# Patient Record
Sex: Male | Born: 1995
Health system: Southern US, Community
[De-identification: ages and names within clinical notes are randomized; demographics above are authoritative.]

## PROBLEM LIST (undated history)

## (undated) DIAGNOSIS — F19959 Other psychoactive substance use, unspecified with psychoactive substance-induced psychotic disorder, unspecified: Secondary | ICD-10-CM

## (undated) DIAGNOSIS — F1910 Other psychoactive substance abuse, uncomplicated: Secondary | ICD-10-CM

## (undated) DIAGNOSIS — F1994 Other psychoactive substance use, unspecified with psychoactive substance-induced mood disorder: Principal | ICD-10-CM

---
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
## 2015-07-01 ENCOUNTER — Encounter: Payer: Self-pay | Admitting: Emergency Medicine

## 2015-07-01 ENCOUNTER — Emergency Department
Admission: EM | Admit: 2015-07-01 | Discharge: 2015-07-01 | Disposition: A | Discharge status: Home / Self Care | Payer: BLUE CROSS/BLUE SHIELD | Attending: Emergency Medicine | Admitting: Emergency Medicine

## 2015-07-01 ENCOUNTER — Emergency Department: Payer: BLUE CROSS/BLUE SHIELD

## 2015-07-01 DIAGNOSIS — R1110 Vomiting, unspecified: Secondary | ICD-10-CM | POA: Diagnosis not present

## 2015-07-01 DIAGNOSIS — R569 Unspecified convulsions: Secondary | ICD-10-CM | POA: Diagnosis not present

## 2015-07-01 DIAGNOSIS — F1210 Cannabis abuse, uncomplicated: Secondary | ICD-10-CM | POA: Diagnosis not present

## 2015-07-01 DIAGNOSIS — R519 Headache, unspecified: Secondary | ICD-10-CM

## 2015-07-01 DIAGNOSIS — F17200 Nicotine dependence, unspecified, uncomplicated: Secondary | ICD-10-CM | POA: Insufficient documentation

## 2015-07-01 DIAGNOSIS — R51 Headache: Secondary | ICD-10-CM | POA: Insufficient documentation

## 2015-07-01 LAB — CBC WITH DIFFERENTIAL/PLATELET
BASOS ABS: 0.1 10*3/uL (ref 0–0.1)
Basophils Relative: 0 %
EOS PCT: 0 %
Eosinophils Absolute: 0 10*3/uL (ref 0–0.7)
HEMATOCRIT: 53.5 % — AB (ref 40.0–52.0)
Hemoglobin: 17.3 g/dL (ref 13.0–18.0)
LYMPHS ABS: 1.4 10*3/uL (ref 1.0–3.6)
LYMPHS PCT: 6 %
MCH: 28.7 pg (ref 26.0–34.0)
MCHC: 32.4 g/dL (ref 32.0–36.0)
MCV: 88.7 fL (ref 80.0–100.0)
MONO ABS: 1 10*3/uL (ref 0.2–1.0)
Monocytes Relative: 4 %
NEUTROS ABS: 20.9 10*3/uL — AB (ref 1.4–6.5)
Neutrophils Relative %: 90 %
Platelets: 275 10*3/uL (ref 150–440)
RBC: 6.03 MIL/uL — AB (ref 4.40–5.90)
RDW: 13.3 % (ref 11.5–14.5)
WBC: 23.4 10*3/uL — AB (ref 3.8–10.6)

## 2015-07-01 LAB — URINALYSIS COMPLETE WITH MICROSCOPIC (ARMC ONLY)
Bacteria, UA: NONE SEEN
Bilirubin Urine: NEGATIVE
Glucose, UA: 50 mg/dL — AB
HGB URINE DIPSTICK: NEGATIVE
Leukocytes, UA: NEGATIVE
Nitrite: NEGATIVE
PH: 6 (ref 5.0–8.0)
Protein, ur: 100 mg/dL — AB
Specific Gravity, Urine: 1.019 (ref 1.005–1.030)

## 2015-07-01 LAB — BASIC METABOLIC PANEL
ANION GAP: 14 (ref 5–15)
BUN: 12 mg/dL (ref 6–20)
CO2: 19 mmol/L — AB (ref 22–32)
Calcium: 9.6 mg/dL (ref 8.9–10.3)
Chloride: 103 mmol/L (ref 101–111)
Creatinine, Ser: 0.91 mg/dL (ref 0.61–1.24)
GFR calc Af Amer: >60 mL/min (ref >60)
GFR calc non Af Amer: >60 mL/min (ref >60)
GLUCOSE: 184 mg/dL — AB (ref 65–99)
POTASSIUM: 4.1 mmol/L (ref 3.5–5.1)
Sodium: 136 mmol/L (ref 135–145)

## 2015-07-01 LAB — URINE DRUG SCREEN, QUALITATIVE (ARMC ONLY)
Amphetamines, Ur Screen: NOT DETECTED
BENZODIAZEPINE, UR SCRN: NOT DETECTED
Barbiturates, Ur Screen: NOT DETECTED
CANNABINOID 50 NG, UR ~~LOC~~: POSITIVE — AB
COCAINE METABOLITE, UR ~~LOC~~: NOT DETECTED
MDMA (ECSTASY) UR SCREEN: NOT DETECTED
Methadone Scn, Ur: NOT DETECTED
OPIATE, UR SCREEN: NOT DETECTED
PHENCYCLIDINE (PCP) UR S: NOT DETECTED
Tricyclic, Ur Screen: NOT DETECTED

## 2015-07-01 MED ORDER — DIPHENHYDRAMINE HCL 50 MG/ML IJ SOLN
25.0000 mg | Freq: Once | INTRAMUSCULAR | Status: AC
  Administered 2015-07-01: 25 mg via INTRAVENOUS
  Filled 2015-07-01: qty 1

## 2015-07-01 MED ORDER — PROCHLORPERAZINE EDISYLATE 5 MG/ML IJ SOLN
10.0000 mg | Freq: Once | INTRAMUSCULAR | Status: AC
  Administered 2015-07-01: 10 mg via INTRAVENOUS
  Filled 2015-07-01: qty 2

## 2015-07-01 MED ORDER — SODIUM CHLORIDE 0.9 % IV BOLUS (SEPSIS)
1000.0000 mL | Freq: Once | INTRAVENOUS | Status: AC
  Administered 2015-07-01: 1000 mL via INTRAVENOUS

## 2015-07-01 NOTE — ED Notes (Signed)
Per EMS, patient was found outside of a car after an apparent seizure.  HE was released from police custody today and after walking a couple of miles got a friend to come pick him up. Pt has family hx of seizure.  Pt presents AOx4 and in no obvious distress other than a 10/10 pain headache which is his only complaint at this time.  EMS stated he was vomiting on scene and gave 4 Zofran which took care of his nausea.

## 2015-07-01 NOTE — Discharge Instructions (Signed)
Dolor de cabeza general sin causa (General Headache Without Cause) El dolor de cabeza es un dolor o malestar que se siente en la zona de la cabeza o del cuello. Puede no tener una causa especfica. Hay muchas causas y tipos de dolores de [place]. Los dolores de cabeza ms comunes son los siguientes:  Cefalea tensional.  Cefaleas migraosas.  Cefalea en brotes.  Cefaleas diarias crnicas. INSTRUCCIONES PARA EL CUIDADO EN EL HOGAR  Controle su afeccin para ver si hay cambios. Siga estos pasos para Scientist, physiological afeccin: Control del [employer] medicamentos de venta libre y los recetados solamente como se lo haya indicado el mdico.  Cuando sienta dolor de cabeza acustese en un cuarto oscuro y tranquilo.  Si se lo indican, aplique hielo sobre la cabeza y la zona del cuello:  Ponga el hielo en una bolsa plstica.  Coloque una toalla entre la piel y la bolsa de hielo.  Coloque el hielo durante , 2 a 3veces por Futures trader.  Utilice una almohadilla trmica o tome una ducha con agua caliente para aplicar calor en la cabeza y la zona del cuello como se lo haya indicado el mdico.  Mantenga las luces tenues si le [employer] luces brillantes o sus dolores de cabeza empeoran. Comida y bebida  Mantenga un horario para las comidas.  Limite el consumo de bebidas alcohlicas.  Consuma menos cantidad de cafena o deje de tomarla. Instrucciones generales  Concurra a todas las visitas de control como se lo haya indicado el mdico. Esto es importante.  Lleve un diario de los dolores de cabeza para Financial risk analyst qu factores pueden desencadenarlos. Por ejemplo, escriba los siguientes datos:  Lo que usted come y Estate agent.  Cunto tiempo duerme.  Algn cambio en su dieta o en los medicamentos.  Pruebe algunas tcnicas de relajacin, como los [place].  Limite el estrs.  Sintese con la espalda recta y no tense los msculos.  No consuma productos que contengan tabaco, incluidos  cigarrillos, tabaco de Theatre manager o cigarrillos electrnicos. Si necesita ayuda para dejar de fumar, consulte al mdico.  Haga actividad fsica habitualmente como se lo haya indicado el mdico.  Tenga un horario fijo para dormir. Duerma entre 7 y 9horas o la cantidad de horas que le haya recomendado el mdico. SOLICITE ATENCIN MDICA SI:   Los medicamentos no Materials engineer los sntomas.  Tiene un dolor de cabeza que es diferente del dolor de cabeza habitual.  Tiene nuseas o vmitos.  Tiene fiebre. SOLICITE ATENCIN MDICA DE INMEDIATO SI:   El dolor se hace cada vez ms intenso.  Ha vomitado repetidas veces.  Presenta rigidez en el cuello.  Sufre prdida de la visin.  Tiene problemas para hablar.  Siente dolor en el ojo o en el odo.  Presenta debilidad muscular o prdida del control muscular.  Pierde el equilibrio o tiene problemas para Advertising account planner.  Sufre mareos o se desmaya.  Se siente confundido.   Esta informacin no tiene Theme park manager el consejo del mdico. Asegrese de hacerle al mdico cualquier pregunta que tenga.   Document Released: 01/11/2005 Document Revised: 12/23/2014 Elsevier Interactive Patient Education 2016 [employer].  Convulsiones en el adulto (Seizure, Adult) Neomia Dear convulsin es una actividad elctrica anormal en el cerebro. Generalmente duran entre 30 segundos y 2 minutos. Hay varios tipos de convulsiones. Antes de una convulsin, puede tener una sensacin de advertencia (aura) que indica que la convulsin est a punto de Radiation protection practitioner. Un aura puede incluir los siguientes sntomas:  [place] o ansiedad.  Nuseas.  Sentir que la habitacin da vueltas (vrtigo).  Cambios en la visin, como ver destellos de luz o [place]. Los sntomas ms comunes durante un ataque son:  Un cambio en la atencin o el comportamiento (estado mental alterado).  Convulsiones con sacudidas rtmicas.  Babeo.  Movimientos rpidos de los  ojos.  Gruidos.  Prdida del control del intestino y la vejiga.  Sabor amargo en la boca.  Mordeduras de [place]y Eyelengua. Despus de un ataque, puede ser que se sienta confundido y adormilado. Tambin podra sufrir una lesin durante la convulsin. INSTRUCCIONES PARA EL CUIDADO EN EL HOGAR   Si le recetaron medicamentos, tmelos exactamente segn se lo indique su mdico.  Cumpla con todas las visitas de control, segn le indique su mdico.  No nade ni conduzca ni participe en actividades Tenet Healthcareriesgosas durante las que una convulsin podra causarle una lesin mayor a usted o a otros hasta que el mdico lo autorice.  Descanse adecuadamente.  Ensee a sus amigos y familiares lo que debe hacer si tiene una convulsin. Ellos deben:  Acostarlo en el suelo para evitar que se caiga.  Poner una almohada debajo de su cabeza.  Aflojar la ropa apretada alrededor de su cuello.  Recostarlo sobre un lado. En caso de tener vmitos, esto ayuda a CBS Corporationmantener las vas area despejadas.  Lennie HummerQuedarse con usted hasta que se recupere.  Sepa si necesita atencin de emergencia o no. SOLICITE ATENCIN MDICA DE INMEDIATO SI:  La convulsin dura ms de cinco minutos.  La convulsin es grave o no se despierta de inmediato despus de la convulsin.  Tiene un estado mental alterado despus de la convulsin.  Tiene convulsiones ms frecuentes o ms graves. Alguien debe llevarlo al departamento de emergencias o llamar a los servicios de emergencias locales (911 en [place] ASEGRESE DE QUE:  Comprende estas instrucciones.  Controlar su afeccin.  Recibir ayuda de inmediato si no mejora o si empeora.   Esta informacin no tiene Theme park managercomo fin reemplazar el consejo del mdico. Asegrese de hacerle al mdico cualquier pregunta que tenga.   Document Released: 01/11/2005 Document Revised: 04/24/2014 Elsevier Interactive Patient Education [employer]2016 Elsevier Inc.

## 2015-07-01 NOTE — ED Provider Notes (Signed)
[facility] Emergency Department Provider Note  ____________________________________________  Time seen: Seen upon arrival to the emergency department  I have reviewed the triage vital signs and the nursing notes.   HISTORY  Chief Complaint Headache and Seizures    HPI Dylan Gay is a 20 y.o. male without any chronic medical problems is presenting to the emergency department today after a possible seizure and headache. He says that he walks several miles to a friend's house after being released from jail. He says he has been feeling weak all over and then vomited. After he vomited he developed a, sudden onset, 10 out of 10 headache frontally, radiating to the back of his head. He describes it as a pressure-like sensation. He denies any history of seizure but is a family history of seizure.He denies any drinking or drug use.  Denies feeling ill lately. Says he not eat anything today but has had fluids to drink.  Denies any focal weakness or numbness. The patient denies losing consciousness and says that he was feeling weak when he was found outside of the car. Denies any chest pain or palpitations. Patient unclear of exact time of onset but says has been since 4 PM.   History reviewed. No pertinent past medical history.  There are no active problems to display for this patient.   History reviewed. No pertinent past surgical history.  No current outpatient prescriptions on file.  Allergies Review of patient's allergies indicates no known allergies.  History reviewed. No pertinent family history.  Social History Social History  Substance Use Topics  . Smoking status: Current Every Day Smoker  . Smokeless tobacco: None  . Alcohol Use: No    Review of Systems Constitutional: No fever/chills Eyes: No visual changes. ENT: No sore throat. Cardiovascular: Denies chest pain. Respiratory: Denies shortness of breath. Gastrointestinal: No  abdominal pain.  No diarrhea.  No constipation. Genitourinary: Negative for dysuria. Musculoskeletal: Negative for back pain. Skin: Negative for rash. Neurological: Negative for focal weakness or numbness.  10-point ROS otherwise negative.  ____________________________________________   PHYSICAL EXAM:  VITAL SIGNS: ED Triage Vitals  Enc Vitals Group     BP 07/01/15 1804 111/78 mmHg     Pulse Rate 07/01/15 1804 97     Resp 07/01/15 1804 18     Temp 07/01/15 1804 98.1 F (36.7 C)     Temp Source 07/01/15 1804 Oral     SpO2 07/01/15 1804 99 %     Weight --      Height --      Head Cir --      Peak Flow --      Pain Score 07/01/15 1812 10     Pain Loc --      Pain Edu? --      Excl. in GC? --     Constitutional: Alert and oriented. Well appearing and in no acute distress. Eyes: Conjunctivae are normal. PERRL. EOMI. Head: Atraumatic. Nose: No congestion/rhinnorhea. Mouth/Throat: Mucous membranes are moist.  Neck: No stridor.  No meningismus. The patient ranges his neck freely.  Cardiovascular: Normal rate, regular rhythm. Grossly normal heart sounds.  Good peripheral circulation. Respiratory: Normal respiratory effort.  No retractions. Lungs CTAB. Gastrointestinal: Soft and nontender. No distention. No abdominal bruits. No CVA tenderness. Musculoskeletal: No lower extremity tenderness nor edema.  No joint effusions. Neurologic:  Normal speech and language. No gross focal neurologic deficits are appreciated.  Skin:  Skin is warm, dry and intact. No  rash noted. Psychiatric: Mood and affect are normal. Speech and behavior are normal.  ____________________________________________   LABS (all labs ordered are listed, but only abnormal results are displayed)  Labs Reviewed  CBC WITH DIFFERENTIAL/PLATELET  BASIC METABOLIC PANEL   ____________________________________________  EKG  ED ECG REPORT I, Kechia Yahnke,  Teena Irani, the attending physician, personally viewed and  interpreted this ECG.   Date: 07/01/2015  EKG Time: 1830  Rate: 107  Rhythm: sinus tachycardia  Axis: Normal axis  Intervals:none  ST&T Change: No diffuse ST elevation with concave morphology consistent with early re-pole. Patient is 20 years old and this is most likely the etiology of this finding.  ____________________________________________  RADIOLOGY  IMPRESSION: Normal head CT.   Electronically Signed By: Rudie Meyer M.D. On: 07/01/2015 18:34 ____________________________________________   PROCEDURES    ____________________________________________   INITIAL IMPRESSION / ASSESSMENT AND PLAN / ED COURSE  Pertinent labs & imaging results that were available during my care of the patient were reviewed by me and considered in my medical decision making (see chart for details).  ----------------------------------------- 6:36 PM on 07/01/2015 -----------------------------------------  Patient's headache is now 5 out of 10 after returning from CAT scan. He has had no medication at this time for pain reduction.  ----------------------------------------- 6:40 PM on 07/01/2015 -----------------------------------------  Patient's friend who witnessed the episode is now the bedside. He says that they were both sitting in his truck at the time and then the patient started screaming. The friend said that he turned around and found that the patient was "twitching." He was unconscious and foaming at the mouth. This one for about 4 minutes. The friend said that the patient was also confused. The patient denies losing control of his bowel or bladder. No history of seizure. No family history of seizure. Also no family history of brain aneurysm. Patient says that he has been sleeping about 6 hours a night over the past several nights. Again denies any alcohol or drugs. Denies any recent head trauma. No history of concussion.  ----------------------------------------- 8:18 PM on  07/01/2015 -----------------------------------------  After medication, the patient continues to improve. He says that his headache is only "slight" at this time. Still without any focal neurological deficits. Mild drowsiness after Benadryl but is fully oriented and reasonable in his responses to his questions and his behavior.  ----------------------------------------- 8:53 PM on 07/01/2015 -----------------------------------------  Patient well appearing. Neurologically intact. Pain is a 2 out of 10 at this time. Likely headache secondary to seizure. Feel that the elevated white blood cell count and low bicarbonate are also secondary to the seizure. No focus of infection identified. Will not start patient on any antiepileptics at this time as this is first seizure. He does not drive. We'll give follow-up with neurology. Negative head CT well within 6 hour window. No meningismus. No focal neurologic deficits. Baseline mental status. Highly unlikely to be intracranial hemorrhage. ____________________________________________   FINAL CLINICAL IMPRESSION(S) / ED DIAGNOSES  Seizure. Headache.    Myrna Blazer, MD 07/01/15 [phone]

## 2015-08-26 ENCOUNTER — Emergency Department
Admission: EM | Admit: 2015-08-26 | Discharge: 2015-08-26 | Disposition: A | Discharge status: Home / Self Care | Payer: BLUE CROSS/BLUE SHIELD | Attending: Student | Admitting: Student

## 2015-08-26 ENCOUNTER — Emergency Department: Payer: BLUE CROSS/BLUE SHIELD

## 2015-08-26 ENCOUNTER — Other Ambulatory Visit: Payer: Self-pay

## 2015-08-26 DIAGNOSIS — R51 Headache: Secondary | ICD-10-CM | POA: Insufficient documentation

## 2015-08-26 DIAGNOSIS — F1290 Cannabis use, unspecified, uncomplicated: Secondary | ICD-10-CM | POA: Insufficient documentation

## 2015-08-26 DIAGNOSIS — R569 Unspecified convulsions: Secondary | ICD-10-CM | POA: Diagnosis present

## 2015-08-26 DIAGNOSIS — F17200 Nicotine dependence, unspecified, uncomplicated: Secondary | ICD-10-CM | POA: Insufficient documentation

## 2015-08-26 LAB — CBC WITH DIFFERENTIAL/PLATELET
BASOS ABS: 0 10*3/uL (ref 0–0.1)
EOS ABS: 0.1 10*3/uL (ref 0–0.7)
HCT: 49.1 % (ref 40.0–52.0)
HEMOGLOBIN: 16 g/dL (ref 13.0–18.0)
Lymphocytes Relative: 30 %
Lymphs Abs: 3.4 10*3/uL (ref 1.0–3.6)
MCH: 28.7 pg (ref 26.0–34.0)
MCHC: 32.6 g/dL (ref 32.0–36.0)
MCV: 88.1 fL (ref 80.0–100.0)
MONO ABS: 1 10*3/uL (ref 0.2–1.0)
Monocytes Relative: 9 %
Neutro Abs: 7.1 10*3/uL — ABNORMAL HIGH (ref 1.4–6.5)
Neutrophils Relative %: 60 %
Platelets: 277 10*3/uL (ref 150–440)
RBC: 5.57 MIL/uL (ref 4.40–5.90)
RDW: 13.2 % (ref 11.5–14.5)
WBC: 11.6 10*3/uL — ABNORMAL HIGH (ref 3.8–10.6)

## 2015-08-26 LAB — URINE DRUG SCREEN, QUALITATIVE (ARMC ONLY)
Amphetamines, Ur Screen: NOT DETECTED
BARBITURATES, UR SCREEN: NOT DETECTED
Benzodiazepine, Ur Scrn: POSITIVE — AB
CANNABINOID 50 NG, UR ~~LOC~~: POSITIVE — AB
Cocaine Metabolite,Ur ~~LOC~~: NOT DETECTED
MDMA (Ecstasy)Ur Screen: NOT DETECTED
Methadone Scn, Ur: NOT DETECTED
Opiate, Ur Screen: NOT DETECTED
Phencyclidine (PCP) Ur S: NOT DETECTED
TRICYCLIC, UR SCREEN: NOT DETECTED

## 2015-08-26 LAB — URINALYSIS COMPLETE WITH MICROSCOPIC (ARMC ONLY)
BACTERIA UA: NONE SEEN
Bilirubin Urine: NEGATIVE
Glucose, UA: NEGATIVE mg/dL
Ketones, ur: NEGATIVE mg/dL
LEUKOCYTES UA: NEGATIVE
Nitrite: NEGATIVE
PH: 5 (ref 5.0–8.0)
PROTEIN: 100 mg/dL — AB
SPECIFIC GRAVITY, URINE: 1.019 (ref 1.005–1.030)

## 2015-08-26 LAB — COMPREHENSIVE METABOLIC PANEL
ALBUMIN: 5 g/dL (ref 3.5–5.0)
ALK PHOS: 84 U/L (ref 38–126)
ALT: 21 U/L (ref 17–63)
ANION GAP: 15 (ref 5–15)
AST: 37 U/L (ref 15–41)
BUN: 13 mg/dL (ref 6–20)
CALCIUM: 9.4 mg/dL (ref 8.9–10.3)
CO2: 21 mmol/L — AB (ref 22–32)
Chloride: 104 mmol/L (ref 101–111)
Creatinine, Ser: 0.99 mg/dL (ref 0.61–1.24)
GFR calc Af Amer: >60 mL/min (ref >60)
GFR calc non Af Amer: >60 mL/min (ref >60)
GLUCOSE: 143 mg/dL — AB (ref 65–99)
POTASSIUM: 3.7 mmol/L (ref 3.5–5.1)
SODIUM: 140 mmol/L (ref 135–145)
Total Bilirubin: 0.7 mg/dL (ref 0.3–1.2)
Total Protein: 8.1 g/dL (ref 6.5–8.1)

## 2015-08-26 LAB — ETHANOL

## 2015-08-26 MED ORDER — LEVETIRACETAM 500 MG PO TABS: 500.0000 mg | ORAL_TABLET | Freq: Two times a day (BID) | ORAL | Status: DC

## 2015-08-26 MED ORDER — IBUPROFEN 400 MG PO TABS: 400.0000 mg | ORAL_TABLET | Freq: Four times a day (QID) | ORAL | Status: DC | PRN

## 2015-08-26 MED ORDER — ACETAMINOPHEN 500 MG PO TABS
1000.0000 mg | ORAL_TABLET | Freq: Once | ORAL | Status: AC
  Administered 2015-08-26: 1000 mg via ORAL
  Filled 2015-08-26: qty 2

## 2015-08-26 MED ORDER — ONDANSETRON HCL 4 MG/2ML IJ SOLN
4.0000 mg | Freq: Once | INTRAMUSCULAR | Status: AC
  Administered 2015-08-26: 4 mg via INTRAVENOUS
  Filled 2015-08-26: qty 2

## 2015-08-26 MED ORDER — SODIUM CHLORIDE 0.9 % IV SOLN
1000.0000 mg | Freq: Once | INTRAVENOUS | Status: AC
  Administered 2015-08-26: 1000 mg via INTRAVENOUS
  Filled 2015-08-26: qty 10

## 2015-08-26 MED ORDER — SODIUM CHLORIDE 0.9 % IV BOLUS (SEPSIS)
1000.0000 mL | Freq: Once | INTRAVENOUS | Status: AC
  Administered 2015-08-26: 1000 mL via INTRAVENOUS

## 2015-08-26 MED ORDER — IBUPROFEN 600 MG PO TABS
600.0000 mg | ORAL_TABLET | Freq: Once | ORAL | Status: AC
  Administered 2015-08-26: 600 mg via ORAL
  Filled 2015-08-26: qty 1

## 2015-08-26 NOTE — Discharge Instructions (Signed)
Do not drive until your neurologist says that you able to do so. Do not ascend to any height from which you could injure yourself if you had a seizure and fell. Do not swim alone until cleared by your neurologist. Return immediately to this emergency department if you develop severe headache, recurrent seizures, fevers, numbness, weakness or for any other concerns.

## 2015-08-26 NOTE — ED Notes (Signed)
Pt from work via EMS, coworkers report seizure activity, repotrs he had a seizure 3 weeks ago. He does not currently take medication. Reports HA

## 2015-08-26 NOTE — ED Notes (Signed)
Pharmacy called for Kepra infusion

## 2015-08-26 NOTE — ED Provider Notes (Signed)
[facility] Provider Note   ____________________________________________  Time seen: Approximately 3:52 PM  I have reviewed the triage vital signs and the nursing notes.   HISTORY  Chief Complaint Seizures    HPI Dylan Gay is a 20 y.o. male with history of one prior seizure, no other medical problems presents for evaluation of possible seizure today which occurred just prior to arrival, sudden onset, no modifying factors. Patient reports that he was standing at work when he became dizzy. His next memory was of lying on the ground with the paramedics surrounding him. According to his coworkers, he was noted to have had some rigidity with some convulsive movements however he did not bite his tongue, did not lose control of his bowel or his bladder. Currently he is complaining of a headache 7 out of 10. He was seen in this emergency department on 07/01/2015 for a first-time seizure, had a reassuring CT head at that time. He has had no fevers, chills,  diarrhea, no chest pain difficulty breathing. No family history of sudden cardiac death. There is a family history of syncope. On arrival to the emergency department, he did have one episode of vomiting but no prior vomiting. He is not prescribed any medications for seizure.   History reviewed. No pertinent past medical history.  There are no active problems to display for this patient.   History reviewed. No pertinent past surgical history.  Current Outpatient Rx  Name  Route  Sig  Dispense  Refill  . ibuprofen (ADVIL,MOTRIN) 400 MG tablet   Oral   Take 1 tablet (400 mg total) by mouth every 6 (six) hours as needed for moderate pain.   20 tablet   0   . levETIRAcetam (KEPPRA) 500 MG tablet   Oral   Take 1 tablet (500 mg total) by mouth 2 (two) times daily.   60 tablet   1     Allergies Review of patient's allergies indicates no known allergies.  No family history  on file.  Social History Social History  Substance Use Topics  . Smoking status: Current Every Day Smoker  . Smokeless tobacco: None  . Alcohol Use: No    Review of Systems Constitutional: No fever/chills Eyes: No visual changes. ENT: No sore throat. Cardiovascular: Denies chest pain. Respiratory: Denies shortness of breath. Gastrointestinal: No abdominal pain.  + nausea, + vomiting.  No diarrhea.  No constipation. Genitourinary: Negative for dysuria. Musculoskeletal: Negative for back pain. Skin: Negative for rash. Neurological: Positive for headache, no focal weakness or numbness.  10-point ROS otherwise negative.  ____________________________________________   PHYSICAL EXAM:  Filed Vitals:   08/26/15 1557 08/26/15 1900 08/26/15 1915 08/26/15 1930  BP: 137/88     Pulse: 93 65 65 76  Temp: 98.2 F (36.8 C)     Resp: 16 16 16 19   SpO2: 97% 98% 96% 95%      Constitutional: Alert and oriented. Well appearing and in no acute distress. Eyes: Conjunctivae are normal. PERRL. EOMI. Head: Atraumatic. Nose: No congestion/rhinnorhea. Mouth/Throat: Mucous membranes are moist.  Oropharynx non-erythematous. Neck: No stridor.  Supple without meningismus. Cardiovascular: Normal rate, regular rhythm. Grossly normal heart sounds.  Good peripheral circulation. Respiratory: Normal respiratory effort.  No retractions. Lungs CTAB. Gastrointestinal: Soft and nontender. No distention. No CVA tenderness. Genitourinary: deferred Musculoskeletal: No lower extremity tenderness nor edema.  No joint effusions. Neurologic:  Normal speech and language. No gross focal neurologic deficits are appreciated. 5 out  of 5 strength bilateral upper and lower extremity, sensation intact to light touch throughout, cranial nerves II through XII intact. Skin:  Skin is warm, dry and intact. No rash noted. Psychiatric: Mood and affect are normal. Speech and behavior are  normal.  ____________________________________________   LABS (all labs ordered are listed, but only abnormal results are displayed)  Labs Reviewed  CBC WITH DIFFERENTIAL/PLATELET - Abnormal; Notable for the following:    WBC 11.6 (*)    Neutro Abs 7.1 (*)    All other components within normal limits  COMPREHENSIVE METABOLIC PANEL - Abnormal; Notable for the following:    CO2 21 (*)    Glucose, Bld 143 (*)    All other components within normal limits  URINALYSIS COMPLETEWITH MICROSCOPIC ([facility] ONLY) - Abnormal; Notable for the following:    Color, Urine YELLOW (*)    APPearance CLEAR (*)    Hgb urine dipstick 2+ (*)    Protein, ur 100 (*)    Squamous Epithelial / LPF 0-5 (*)    All other components within normal limits  URINE DRUG SCREEN, QUALITATIVE ([facility] ONLY) - Abnormal; Notable for the following:    Cannabinoid 50 Ng, Ur [place] POSITIVE (*)    Benzodiazepine, Ur Scrn POSITIVE (*)    All other components within normal limits  ETHANOL   ____________________________________________  EKG  ED ECG REPORT I, Gayla DossGayle, Jackie Russman A, the attending physician, personally viewed and interpreted this ECG.   Date: 08/26/2015  EKG Time: 15:48  Rate: 101  Rhythm: sinus tachycardia  Axis: normal  Intervals:none  ST&T Change: No acute ST elevation. Normal QTC. ____________________________________________  RADIOLOGY  CT head IMPRESSION: Stable and negative head CT. ____________________________________________   PROCEDURES  Procedure(s) performed: None  Critical Care performed: No  ____________________________________________   INITIAL IMPRESSION / ASSESSMENT AND PLAN / ED COURSE  Pertinent labs & imaging results that were available during my care of the patient were reviewed by me and considered in my medical decision making (see chart for details).  Dylan Gay is a 20 y.o. male with history of one prior seizure, no other medical problems presents for  evaluation of possible seizure today which occurred just prior to arrival, sudden onset. On exam, he is very well-appearing and in no acute distress, vital signs are stable, he is afebrile. He is awake, alert, oriented, testing on the Smart phone. He has an intact neurological examination. Suspect recurrent seizure though syncope is also on the differential. He has had one prior seizure in his life time, we'll load him with Keppra, obtain screening labs, reassess for disposition.  ----------------------------------------- 7:40 PM on 08/26/2015 ----------------------------------------- The patient is resting comfortably in no acute distress, he awakens to voice and light touch and continues to have a nonfocal neurological examination. He indicates well my presence with no gait instability. I reviewed his labs which are notable for mild leukocytosis which is improved from prior. Unremarkable CMP. Urinalysis consistent with infection. Urine drug screen positive for cannabis as well as benzos. Negative ethanol level. He was complaining a 7 out of 10 headache as being here and remember that he did hit his head so CT head was obtained to evaluate for any acute medical pathology and CT head is negative. I discussed return precautions with him as well as need for close neurology follow-up. We'll start him on twice a day Keppra. He is comfortable with the discharge plan. DC home. I initially brought the Spanish interpreter with me to discharge the patient  however the patient reported that he is comfortable with the explanation English and he does not need a [place] interpreter. ____________________________________________   FINAL CLINICAL IMPRESSION(S) / ED DIAGNOSES  Final diagnoses:  Seizure (HCC)      NEW MEDICATIONS STARTED DURING THIS VISIT:  New Prescriptions   IBUPROFEN (ADVIL,MOTRIN) 400 MG TABLET    Take 1 tablet (400 mg total) by mouth every 6 (six) hours as needed for moderate pain.    LEVETIRACETAM (KEPPRA) 500 MG TABLET    Take 1 tablet (500 mg total) by mouth 2 (two) times daily.     Note:  This document was prepared using Dragon voice recognition software and may include unintentional dictation errors.    Gayla Doss, MD 08/26/15 1944

## 2015-08-26 NOTE — ED Notes (Signed)
Pt reports knot on forehead, reports he thinks he may have hit head on table. EDP made aware

## 2020-03-13 ENCOUNTER — Emergency Department: Payer: Self-pay

## 2020-03-13 ENCOUNTER — Emergency Department
Admission: EM | Admit: 2020-03-13 | Discharge: 2020-03-13 | Disposition: A | Discharge status: Home / Self Care | Payer: No Typology Code available for payment source | Attending: Emergency Medicine | Admitting: Emergency Medicine

## 2020-03-13 ENCOUNTER — Other Ambulatory Visit: Payer: Self-pay

## 2020-03-13 DIAGNOSIS — Z20822 Contact with and (suspected) exposure to covid-19: Secondary | ICD-10-CM | POA: Insufficient documentation

## 2020-03-13 DIAGNOSIS — F1910 Other psychoactive substance abuse, uncomplicated: Secondary | ICD-10-CM

## 2020-03-13 DIAGNOSIS — F19251 Other psychoactive substance dependence with psychoactive substance-induced psychotic disorder with hallucinations: Secondary | ICD-10-CM | POA: Insufficient documentation

## 2020-03-13 DIAGNOSIS — F1590 Other stimulant use, unspecified, uncomplicated: Secondary | ICD-10-CM | POA: Insufficient documentation

## 2020-03-13 DIAGNOSIS — F14959 Cocaine use, unspecified with cocaine-induced psychotic disorder, unspecified: Secondary | ICD-10-CM | POA: Diagnosis not present

## 2020-03-13 DIAGNOSIS — F17200 Nicotine dependence, unspecified, uncomplicated: Secondary | ICD-10-CM | POA: Insufficient documentation

## 2020-03-13 DIAGNOSIS — F19959 Other psychoactive substance use, unspecified with psychoactive substance-induced psychotic disorder, unspecified: Secondary | ICD-10-CM | POA: Diagnosis present

## 2020-03-13 LAB — COMPREHENSIVE METABOLIC PANEL
ALT: 27 U/L (ref 0–44)
AST: 35 U/L (ref 15–41)
Albumin: 4.9 g/dL (ref 3.5–5.0)
Alkaline Phosphatase: 87 U/L (ref 38–126)
Anion gap: 13 (ref 5–15)
BUN: 19 mg/dL (ref 6–20)
CO2: 24 mmol/L (ref 22–32)
Calcium: 10.6 mg/dL — ABNORMAL HIGH (ref 8.9–10.3)
Chloride: 98 mmol/L (ref 98–111)
Creatinine, Ser: 0.94 mg/dL (ref 0.61–1.24)
GFR, Estimated: >60 mL/min (ref >60)
Glucose, Bld: 112 mg/dL — ABNORMAL HIGH (ref 70–99)
Potassium: 3.8 mmol/L (ref 3.5–5.1)
Sodium: 135 mmol/L (ref 135–145)
Total Bilirubin: 1.3 mg/dL — ABNORMAL HIGH (ref 0.3–1.2)
Total Protein: 9 g/dL — ABNORMAL HIGH (ref 6.5–8.1)

## 2020-03-13 LAB — TROPONIN I (HIGH SENSITIVITY): Troponin I (High Sensitivity): 7 ng/L (ref <18)

## 2020-03-13 LAB — CBC
HCT: 49.2 % (ref 39.0–52.0)
Hemoglobin: 16.2 g/dL (ref 13.0–17.0)
MCH: 27.7 pg (ref 26.0–34.0)
MCHC: 32.9 g/dL (ref 30.0–36.0)
MCV: 84.1 fL (ref 80.0–100.0)
Platelets: 341 10*3/uL (ref 150–400)
RBC: 5.85 MIL/uL — ABNORMAL HIGH (ref 4.22–5.81)
RDW: 12.3 % (ref 11.5–15.5)
WBC: 15.8 10*3/uL — ABNORMAL HIGH (ref 4.0–10.5)
nRBC: 0 % (ref 0.0–0.2)

## 2020-03-13 LAB — ACETAMINOPHEN LEVEL: Acetaminophen (Tylenol), Serum: <10 ug/mL — ABNORMAL LOW (ref 10–30)

## 2020-03-13 LAB — RESP PANEL BY RT-PCR (FLU A&B, COVID) ARPGX2
Influenza A by PCR: NEGATIVE
Influenza B by PCR: NEGATIVE
SARS Coronavirus 2 by RT PCR: NEGATIVE

## 2020-03-13 LAB — ETHANOL: Alcohol, Ethyl (B): <10 mg/dL (ref <10)

## 2020-03-13 LAB — SALICYLATE LEVEL: Salicylate Lvl: <7 mg/dL — ABNORMAL LOW (ref 7.0–30.0)

## 2020-03-13 MED ORDER — GABAPENTIN 300 MG PO CAPS
300.0000 mg | ORAL_CAPSULE | Freq: Three times a day (TID) | ORAL | Status: DC
  Administered 2020-03-13: 300 mg via ORAL
  Filled 2020-03-13: qty 1

## 2020-03-13 MED ORDER — LORAZEPAM 2 MG PO TABS
2.0000 mg | ORAL_TABLET | Freq: Once | ORAL | Status: AC
  Administered 2020-03-13: 2 mg via ORAL
  Filled 2020-03-13: qty 1

## 2020-03-13 MED ORDER — RISPERIDONE 1 MG PO TABS: 0.5000 mg | ORAL_TABLET | Freq: Two times a day (BID) | ORAL | Status: DC

## 2020-03-13 NOTE — ED Triage Notes (Signed)
Pt comes EMS from home with family. Pt was brought to [facility] and [facility] sent pt here. Pt had about 1g of cocaine last night. States there are people in his house trying to poison him and his brother. Pt fidgety but cooperative.

## 2020-03-13 NOTE — BH Assessment (Signed)
Assessment Note  Dylan Gay is an 24 y.o. male who presents to the ER due to his mother [phone]) having concerns about the current state of his mental health. Per the patient, three males are in his home trying to kill him and his family. He states he can see them and hear them at night. He acknowledges he uses cocaine once a month and they (males) came to the home, following his recent drug use. However, he has limited insight about how the hallucinations may be a result of his cocaine use.  Per the report of the patient's mother, the patient's behaviors have changed within the last several days. He texted her a message she was unable to understand, about someone trying to harm her and the family. His brother was the first one to notice he was paranoid and "wasn't acting right." Mother further reports her knowledge of his drug use and believes his current behaviors and mental state are a result of it. She is requesting patient be admitted to a rehab center. (Interpreter was used to speak with mother via phone).  During the interview, the patient was anxious and nervous. He was unable to remain still, while laying in the bed. While sharing what happened to bring him to the ER, he started crying and would shake his head, while rubbing his chest. Patient repeated several times he was in the ER because his family was scared he was going to get them hurt by the three men who were in their home. He was talking about them and the mother was afraid they were going to hear him and hurt them. He further reported, the mother knows them and know how they do things and it's best to stay quiet and not talk about them or they will get upset, which he wasn't doing. Throughout the interview, the patient attempted to cooperate and be pleasant.  Diagnosis: Substance Induced Psychosis  Past Medical History: No past medical history on file.  No past surgical history on file.  Family History: No  family history on file.  Social History:  reports that he has been smoking. He does not have any smokeless tobacco history on file. He reports current drug use. Drug: Marijuana. He reports that he does not drink alcohol.  Additional Social History:  Alcohol / Drug Use Pain Medications: See PTA Prescriptions: See PTA Over the Counter: See PTA History of alcohol / drug use?: Yes Longest period of sobriety (when/how long): Unable to quantify Substance #1 Name of Substance 1: Cocaine 1 - Frequency: "One time a month" 1 - Last Use / Amount: 03/12/2020  CIWA: CIWA-Ar BP: (!) 150/82 Pulse Rate: 97 COWS:    Allergies: No Known Allergies  Home Medications: (Not in a hospital admission)  OB/GYN Status:  No LMP for male patient.  General Assessment Data Location of Assessment: [facility] ED TTS Assessment: In system Is this a Tele or Face-to-Face Assessment?: Face-to-Face Is this an Initial Assessment or a Re-assessment for this encounter?: Initial Assessment Patient Accompanied by:: N/A Language Other than English: No Living Arrangements: Other (Comment) (Private Home) What gender do you identify as?: Male Date Telepsych consult ordered in [facility]: 03/13/20 Time Telepsych consult ordered in [facility]: 1238 Marital status: Single Pregnancy Status: No Living Arrangements: Parent, Other relatives Can pt return to current living arrangement?: Yes Admission Status: Involuntary Petitioner: Other Is patient capable of signing voluntary admission?: No (Under IVC) Referral Source: Self/Family/Friend Insurance type: None  Medical Screening Exam [facility] Walk-in ONLY)  Medical Exam completed: Yes  Crisis Care Plan Living Arrangements: Parent, Other relatives Legal Guardian: Other: (Self) Name of Psychiatrist: Reports of none Name of Therapist: Reports of none  Education Status Is patient currently in school?: No Is the patient employed, unemployed or receiving disability?: Unemployed  Risk to self  with the past 6 months Suicidal Ideation: No Has patient been a risk to self within the past 6 months prior to admission? : No Suicidal Intent: No Has patient had any suicidal intent within the past 6 months prior to admission? : No Is patient at risk for suicide?: No Suicidal Plan?: No Has patient had any suicidal plan within the past 6 months prior to admission? : No Access to Means: No What has been your use of drugs/alcohol within the last 12 months?: Cocaine Previous Attempts/Gestures: No How many times?: 0 Other Self Harm Risks: Reports of none Triggers for Past Attempts: Hallucinations Intentional Self Injurious Behavior: None Family Suicide History: Unknown Recent stressful life event(s): Other (Comment) (Drug use) Persecutory voices/beliefs?: No Depression: Yes Depression Symptoms: Tearfulness, Insomnia, Isolating, Fatigue, Loss of interest in usual pleasures, Feeling worthless/self pity, Feeling angry/irritable Substance abuse history and/or treatment for substance abuse?: Yes Suicide prevention information given to non-admitted patients: Not applicable  Risk to Others within the past 6 months Homicidal Ideation: No Does patient have any lifetime risk of violence toward others beyond the six months prior to admission? : No Thoughts of Harm to Others: No Current Homicidal Intent: No Current Homicidal Plan: No Access to Homicidal Means: No Identified Victim: Reports of none History of harm to others?: No Assessment of Violence: None Noted Violent Behavior Description: Reports of none Does patient have access to weapons?: No Criminal Charges Pending?: No Does patient have a court date: No Is patient on probation?: No  Psychosis Hallucinations: Auditory, Visual Delusions: Persecutory  Mental Status Report Appearance/Hygiene: Unremarkable, In scrubs Eye Contact: Fair Motor Activity: Freedom of movement, Unremarkable Speech: Logical/coherent, Unremarkable Level of  Consciousness: Alert Mood: Anxious, Suspicious, Helpless, Pleasant Affect: Anxious, Preoccupied Anxiety Level: Moderate Thought Processes: Coherent, Relevant Judgement: Partial Orientation: Person, Place, Time, Appropriate for developmental age Obsessive Compulsive Thoughts/Behaviors: Moderate  Cognitive Functioning Concentration: Decreased Memory: Recent Intact, Remote Intact Is patient IDD: No Insight: Poor Impulse Control: Fair Appetite: Good Have you had any weight changes? : No Change Sleep: Decreased Total Hours of Sleep: 4 Vegetative Symptoms: None  ADLScreening [facility] Assessment Services) Patient's cognitive ability adequate to safely complete daily activities?: Yes Patient able to express need for assistance with ADLs?: Yes Independently performs ADLs?: Yes (appropriate for developmental age)  Prior Inpatient Therapy Prior Inpatient Therapy: No  Prior Outpatient Therapy Prior Outpatient Therapy: No Does patient have an ACCT team?: No Does patient have Intensive In-House Services?  : No Does patient have [facility] services? : No Does patient have P4CC services?: No  ADL Screening (condition at time of admission) Patient's cognitive ability adequate to safely complete daily activities?: Yes Is the patient deaf or have difficulty hearing?: No Does the patient have difficulty seeing, even when wearing glasses/contacts?: No Does the patient have difficulty concentrating, remembering, or making decisions?: No Patient able to express need for assistance with ADLs?: Yes Does the patient have difficulty dressing or bathing?: No Independently performs ADLs?: Yes (appropriate for developmental age) Does the patient have difficulty walking or climbing stairs?: No Weakness of Legs: None Weakness of Arms/Hands: None  Home Assistive Devices/Equipment Home Assistive Devices/Equipment: None  Therapy Consults (therapy consults require a physician order)  PT Evaluation Needed:  No OT Evalulation Needed: No SLP Evaluation Needed: No Abuse/Neglect Assessment (Assessment to be complete while patient is alone) Abuse/Neglect Assessment Can Be Completed: Yes Physical Abuse: Denies Verbal Abuse: Denies Sexual Abuse: Denies Exploitation of patient/patient's resources: Denies Self-Neglect: Denies Values / Beliefs Cultural Requests During Hospitalization: None Spiritual Requests During Hospitalization: None Consults Spiritual Care Consult Needed: No Transition of Care Team Consult Needed: No Advance Directives (For Healthcare) Does Patient Have a Medical Advance Directive?: No  Disposition:  Disposition Initial Assessment Completed for this Encounter: Yes  On Site Evaluation by:   Reviewed with Physician:    Lilyan Gilford MS, LCAS, [facility], NCC Therapeutic Triage Specialist 03/13/2020 6:15 PM

## 2020-03-13 NOTE — Consult Note (Signed)
[facility] Face-to-Face Psychiatry Consult   Reason for Consult:  Drug Induced Psychosis Referring Physician:  Dr. 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 male patient admitted with cocaine-induced psychotic disorder.  HPI: Patient seen and evaluated by this provider. Patient is presenting to the ED with "paranoia" and thoughts of someone being after him and his family. He also presents with anxiety and tachycardia. Patient on heart monitor during assessment. Patient states that he does "1 gram of cocaine once a month." Per conversation he doesn't think it's a problem in the present. Patient mother was called for collateral information and per conversation he has been paranoid for months. This was first noticed by the patients younger brother and mother states she noticed it this morning. Patient states that he is very stressed out at home because of things that are going on and people who are threatening to hurt his family. Per mother patient has been participating in a variety of drugs like cocaine and marijuana for 2+ years, she is unsure of exact time. Patient endorses anxiety regarding his actual and perceived circumstances. Patient currently denies suicidal ideations, homicidal ideations, auditory/visual hallucinations.  Past Psychiatric History: None  Risk to  Self: No Risk to Others: No Prior Inpatient Therapy: No Prior Outpatient Therapy: No  Past Medical History: No past medical history on file. No past surgical history on file. Family History: No family history on file. Family Psychiatric History: None assessed Social History: Positive Marijuana and Cocaine use Social History   Substance and Sexual Activity  Alcohol  Use No     Social History   Substance and Sexual Activity  Drug Use Yes  . Types: Marijuana    Social History   Socioeconomic History  . Marital status: Single    Spouse name: Not on file  . Number of children: Not on file  . Years of education: Not on file  . Highest education level: Not on file  Occupational History  . Not on file  Tobacco Use  . Smoking status: Current Every Day Smoker  Substance and Sexual Activity  . Alcohol use: No  . Drug use: Yes    Types: Marijuana  . Sexual activity: Not on file  Other Topics Concern  . Not on file  Social History Narrative  . Not on file   Social Determinants of Health   Financial Resource Strain:   . Difficulty of Paying Living Expenses: Not on file  Food Insecurity:   . Worried About Programme researcher, broadcasting/film/video in the Last Year: Not on file  . Ran Out of Food in the Last Year: Not on file  Transportation Needs:   . Lack of Transportation (Medical): Not on file  . Lack of Transportation (Non-Medical): Not on file  Physical Activity:   . Days of Exercise per Week: Not on file  . Minutes of Exercise per Session: Not on file  Stress:   . Feeling of Stress : Not on file  Social Connections:   . Frequency of Communication with Friends and Family: Not on file  . Frequency of Social Gatherings with Friends and Family: Not on file  . Attends Religious Services: Not on file  . Active Member of Clubs or Organizations: Not on file  . Attends Club  or Organization Meetings: Not on file  . Marital Status: Not on file   Additional Social History:    Allergies:  No Known Allergies  Labs:  Results for orders placed or performed during the hospital encounter of 03/13/20 (from the past 48 hour(s))  Comprehensive metabolic panel     Status: Abnormal   Collection Time: 03/13/20 12:32 PM  Result Value Ref Range   Sodium 135 135 - 145 mmol/L   Potassium 3.8 3.5 - 5.1 mmol/L   Chloride 98 98 - 111 mmol/L   CO2 24 22 - 32 mmol/L    Glucose, Bld 112 (H) 70 - 99 mg/dL    Comment: Glucose reference range applies only to samples taken after fasting for at least 8 hours.   BUN 19 6 - 20 mg/dL   Creatinine, Ser 7.90 0.61 - 1.24 mg/dL   Calcium 24.0 (H) 8.9 - 10.3 mg/dL   Total Protein 9.0 (H) 6.5 - 8.1 g/dL   Albumin 4.9 3.5 - 5.0 g/dL   AST 35 15 - 41 U/L   ALT 27 0 - 44 U/L   Alkaline Phosphatase 87 38 - 126 U/L   Total Bilirubin 1.3 (H) 0.3 - 1.2 mg/dL   GFR, Estimated >97 >35 mL/min    Comment: (NOTE) Calculated using the CKD-EPI Creatinine Equation (2021)    Anion gap 13 5 - 15    Comment: Performed at [facility], [address] [place], [place] [address]  Ethanol     Status: None   Collection Time: 03/13/20 12:32 PM  Result Value Ref Range   Alcohol, Ethyl (B) <10 <10 mg/dL    Comment: (NOTE) Lowest detectable limit for serum alcohol is 10 mg/dL.  For medical purposes only. Performed at [facility], [address] [place], [place] [address]   Salicylate level     Status: Abnormal   Collection Time: 03/13/20 12:32 PM  Result Value Ref Range   Salicylate Lvl <7.0 (L) 7.0 - 30.0 mg/dL    Comment: Performed at [facility], [address] [place], [place] [address]  Acetaminophen level     Status: Abnormal   Collection Time: 03/13/20 12:32 PM  Result Value Ref Range   Acetaminophen (Tylenol), Serum <10 (L) 10 - 30 ug/mL    Comment: (NOTE) Therapeutic concentrations vary significantly. A range of 10-30 ug/mL  may be an effective concentration for many patients. However, some  are best treated at concentrations outside of this range. Acetaminophen concentrations >150 ug/mL at 4 hours after ingestion  and >50 ug/mL at 12 hours after ingestion are often associated with  toxic reactions.  Performed at [facility], [address] [place], [place] [address]   cbc     Status: Abnormal   Collection Time: 03/13/20 12:32 PM  Result Value Ref Range   WBC 15.8  (H) 4.0 - 10.5 K/uL   RBC 5.85 (H) 4.22 - 5.81 MIL/uL   Hemoglobin 16.2 13.0 - 17.0 g/dL   HCT 89.2 39 - 52 %   MCV 84.1 80.0 - 100.0 fL   MCH 27.7 26.0 - 34.0 pg   MCHC 32.9 30.0 - 36.0 g/dL   RDW 11.9 41.7 - 40.8 %   Platelets 341 150 - 400 K/uL   nRBC 0.0 0.0 - 0.2 %    Comment: Performed at [facility], [address] [place], [place] [address]  Troponin I (High Sensitivity)     Status: None   Collection Time:  03/13/20 12:32 PM  Result Value Ref Range   Troponin I (High Sensitivity) 7 <18 ng/L    Comment: (NOTE) Elevated high sensitivity troponin I (hsTnI) values and significant  changes across serial measurements may suggest ACS but many other  chronic and acute conditions are known to elevate hsTnI results.  Refer to the "Links" section for chest pain algorithms and additional  guidance. Performed at [facility], [address] [place], [place] [address]     No current facility-administered medications for this encounter.   Current Outpatient Medications  Medication Sig Dispense Refill  . ibuprofen (ADVIL,MOTRIN) 400 MG tablet Take 1 tablet (400 mg total) by mouth every 6 (six) hours as needed for moderate pain. 20 tablet 0  . levETIRAcetam (KEPPRA) 500 MG tablet Take 1 tablet (500 mg total) by mouth 2 (two) times daily. 60 tablet 1    Musculoskeletal: Strength & Muscle Tone: within normal limits Gait & Station: normal Patient leans: N/A  Psychiatric Specialty Exam: Physical Exam Vitals and nursing note reviewed.  HENT:     Head: Normocephalic.     Nose: Nose normal.  Pulmonary:     Effort: Pulmonary effort is normal.  Musculoskeletal:        General: Normal range of motion.     Cervical back: Normal range of motion.  Neurological:     General: No focal deficit present.     Mental Status: He is alert and oriented to person, place, and time.  Psychiatric:        Attention and Perception: Attention normal.        Mood and Affect: Mood  is anxious.        Speech: Speech normal.        Behavior: Behavior normal. Behavior is cooperative.        Thought Content: Thought content is paranoid.        Cognition and Memory: Cognition is impaired.        Judgment: Judgment is impulsive.     Review of Systems  Psychiatric/Behavioral: Positive for hallucinations. The patient is nervous/anxious.   All other systems reviewed and are negative.   Blood pressure (!) 151/97, pulse (!) 108, temperature 98.5 F (36.9 C), temperature source Oral, resp. rate 18, height 5\' 5"  (1.651 m), weight 88.5 kg, SpO2 94 %.Body mass index is 32.45 kg/m.  General Appearance: Disheveled and Guarded  Eye Contact:  Fair  Speech:  Clear and Coherent and Normal Rate  Volume:  Normal  Mood:  Anxious and Irritable  Affect:  Depressed and Tearful  Thought Process:  Coherent and Linear  Orientation:  Full (Time, Place, and Person)  Thought Content:  Logical and Paranoid Ideation  Suicidal Thoughts:  No  Homicidal Thoughts:  No  Memory:  Immediate;   Fair Recent;   Fair Remote;   Fair  Judgement:  Impaired  Insight:  Lacking  Psychomotor Activity:  Increased and Restlessness  Concentration:  Concentration: Good and Attention Span: Good  Recall:  Fair  Fund of Knowledge:  Good  Language:  Good  Akathisia:  No  Handed:  Right  AIMS (if indicated):     Assets:  Communication Skills Desire for Improvement Housing Physical Health Resilience Social Support  ADL's:  Intact  Cognition:  Impaired,  Moderate  Sleep:        Treatment Plan Summary: Daily contact with patient to assess and evaluate symptoms and progress in treatment, Medication management and Plan cocaine induced psychosis:  -Start gabapentin  300 mg TID -Start Risperdal 0.5 mg BID -Explore rehab when clear  Disposition: No evidence of imminent risk to self or others at present.   Patient does not meet criteria for psychiatric inpatient admission. Supportive therapy provided about  ongoing stressors.  Nanine MeansJamison Kaikoa Magro, NP 03/13/2020 3:38 PM

## 2020-03-13 NOTE — ED Notes (Signed)
Pt's mother updated on plan of care. Verbalized understanding of visitation. Mother given spanish version of patient rules.

## 2020-03-13 NOTE — ED Notes (Signed)
Psych team at bedside .

## 2020-03-13 NOTE — ED Notes (Signed)
ED Provider at bedside. 

## 2020-03-13 NOTE — ED Provider Notes (Signed)
Nursing staff informed me that patient wanted to leave. Patient is not IVCed. In my examination the patient currently denies any SI/HI. Denies any hallucinations. Appears calm and appropriate. Did recommend that patient seek out rehab given drug use.    Phineas Semen, MD 03/13/20 2001

## 2020-03-13 NOTE — ED Provider Notes (Signed)
[facility] Emergency Department Provider Note   ____________________________________________    I have reviewed the triage vital signs and the nursing notes.   HISTORY  Chief Complaint Psychiatric Evaluation     HPI Dylan Gay is a 24 y.o. male who presents for psychiatric evaluation.  Patient sent from [facility] for significant paranoia.  Apparently patient used cocaine last night and since then he has been very paranoid and anxious which is atypical for him.  No reported history of mental illness.  Denies other drug use.  Denies chest pain.  No shortness of breath reported.  No past medical history on file.  There are no problems to display for this patient.   No past surgical history on file.  Prior to Admission medications   Medication Sig Start Date End Date Taking? Authorizing Provider  ibuprofen (ADVIL,MOTRIN) 400 MG tablet Take 1 tablet (400 mg total) by mouth every 6 (six) hours as needed for moderate pain. 08/26/15   Gayla Doss, MD  levETIRAcetam (KEPPRA) 500 MG tablet Take 1 tablet (500 mg total) by mouth 2 (two) times daily. 08/26/15   Gayla Doss, MD     Allergies Patient has no known allergies.  No family history on file.  Social History Social History   Tobacco Use  . Smoking status: Current Every Day Smoker  Substance Use Topics  . Alcohol use: No  . Drug use: Yes    Types: Marijuana    Review of Systems  Constitutional: No fever/chills Eyes: No visual changes.  ENT: No sore throat. Cardiovascular: Denies chest pain. Respiratory: Denies shortness of breath. Gastrointestinal: No abdominal pain.  No nausea, no vomiting.   Genitourinary: Negative for dysuria. Musculoskeletal: Negative for back pain. Skin: Negative for rash. Neurological: Negative for headaches or weakness   ____________________________________________   PHYSICAL EXAM:  VITAL SIGNS: ED Triage Vitals  Enc Vitals Group     BP  03/13/20 1227 (!) 151/97     Pulse Rate 03/13/20 1227 (!) 108     Resp 03/13/20 1227 18     Temp 03/13/20 1227 98.5 F (36.9 C)     Temp Source 03/13/20 1227 Oral     SpO2 03/13/20 1227 94 %     Weight 03/13/20 1224 88.5 kg (195 lb)     Height 03/13/20 1224 1.651 m (5\' 5" )     Head Circumference --      Peak Flow --      Pain Score 03/13/20 1224 0     Pain Loc --      Pain Edu? --      Excl. in GC? --     Constitutional: Alert and oriented. No acute distress.  Difficulty sitting still Eyes: Conjunctivae are normal.  Head: Atraumatic. Nose: No congestion/rhinnorhea. Mouth/Throat: Mucous membranes are moist.    Cardiovascular: Normal rate, regular rhythm. Grossly normal heart sounds.  Good peripheral circulation. Respiratory: Normal respiratory effort.  No retractions. Lungs CTAB. Gastrointestinal: Soft and nontender. No distention.  No CVA tenderness.  Musculoskeletal: No lower extremity tenderness nor edema.  Warm and well perfused Neurologic:  Normal speech and language. No gross focal neurologic deficits are appreciated.  Skin:  Skin is warm, dry and intact. No rash noted. Psychiatric: Mood and affect are normal. Speech and behavior are normal.  ____________________________________________   LABS (all labs ordered are listed, but only abnormal results are displayed)  Labs Reviewed  COMPREHENSIVE METABOLIC PANEL - Abnormal; Notable for the following  components:      Result Value   Glucose, Bld 112 (*)    Calcium 10.6 (*)    Total Protein 9.0 (*)    Total Bilirubin 1.3 (*)    All other components within normal limits  SALICYLATE LEVEL - Abnormal; Notable for the following components:   Salicylate Lvl <7.0 (*)    All other components within normal limits  ACETAMINOPHEN LEVEL - Abnormal; Notable for the following components:   Acetaminophen (Tylenol), Serum <10 (*)    All other components within normal limits  CBC - Abnormal; Notable for the following components:    WBC 15.8 (*)    RBC 5.85 (*)    All other components within normal limits  ETHANOL  URINE DRUG SCREEN, QUALITATIVE ([facility] ONLY)  CBG MONITORING, ED  TROPONIN I (HIGH SENSITIVITY)   ____________________________________________  EKG  ED ECG REPORT I, Jene Every, the attending physician, personally viewed and interpreted this ECG.  Date: 03/13/2020  Rhythm: normal sinus rhythm QRS Axis: normal Intervals: normal ST/T Wave abnormalities: normal Narrative Interpretation: no evidence of acute ischemia  ____________________________________________  RADIOLOGY  Chest x-ray reviewed by me, no infiltrate ____________________________________________   PROCEDURES  Procedure(s) performed: No  Procedures   Critical Care performed:No ____________________________________________   INITIAL IMPRESSION / ASSESSMENT AND PLAN / ED COURSE  Pertinent labs & imaging results that were available during my care of the patient were reviewed by me and considered in my medical decision making (see chart for details).  Patient presents with paranoia status post substance abuse.  He is able to answer questions mostly appropriately although does have a underlying paranoia as noted by [facility].  Lab work today is overall reassuring, mildly elevated white blood cell count is nonspecific, likely related to drug use.  I have consulted TTS and psychiatry to evaluate the patient.  Gave Ativan p.o. to counteract the effects of the cocaine  The patient has been placed in psychiatric observation due to the need to provide a safe environment for the patient while obtaining psychiatric consultation and evaluation, as well as ongoing medical and medication management to treat the patient's condition.  The patient has not been placed under full IVC at this time.     ____________________________________________   FINAL CLINICAL IMPRESSION(S) / ED DIAGNOSES  Final diagnoses:  Substance abuse (HCC)         Note:  This document was prepared using Dragon voice recognition software and may include unintentional dictation errors.   Jene Every, MD 03/13/20 1511

## 2020-03-13 NOTE — ED Notes (Signed)
Pt has visitor at bedside-wife.

## 2020-03-13 NOTE — Discharge Instructions (Addendum)
Please seek medical attention for any high fevers, chest pain, shortness of breath, change in behavior, persistent vomiting, bloody stool or any other new or concerning symptoms.  

## 2020-03-23 ENCOUNTER — Other Ambulatory Visit: Payer: Self-pay

## 2020-03-23 ENCOUNTER — Emergency Department
Admission: EM | Admit: 2020-03-23 | Discharge: 2020-03-24 | Disposition: A | Discharge status: Other Acute Inpatient Hospital | Payer: No Typology Code available for payment source | Attending: Student in an Organized Health Care Education/Training Program | Admitting: Student in an Organized Health Care Education/Training Program

## 2020-03-23 DIAGNOSIS — F1410 Cocaine abuse, uncomplicated: Secondary | ICD-10-CM

## 2020-03-23 DIAGNOSIS — Z20822 Contact with and (suspected) exposure to covid-19: Secondary | ICD-10-CM | POA: Insufficient documentation

## 2020-03-23 DIAGNOSIS — F1910 Other psychoactive substance abuse, uncomplicated: Secondary | ICD-10-CM | POA: Insufficient documentation

## 2020-03-23 DIAGNOSIS — F322 Major depressive disorder, single episode, severe without psychotic features: Secondary | ICD-10-CM | POA: Diagnosis not present

## 2020-03-23 DIAGNOSIS — R45851 Suicidal ideations: Secondary | ICD-10-CM

## 2020-03-23 DIAGNOSIS — Z87891 Personal history of nicotine dependence: Secondary | ICD-10-CM | POA: Insufficient documentation

## 2020-03-23 LAB — COMPREHENSIVE METABOLIC PANEL
ALT: 22 U/L (ref 0–44)
AST: 22 U/L (ref 15–41)
Albumin: 4.8 g/dL (ref 3.5–5.0)
Alkaline Phosphatase: 87 U/L (ref 38–126)
Anion gap: 10 (ref 5–15)
BUN: 11 mg/dL (ref 6–20)
CO2: 26 mmol/L (ref 22–32)
Calcium: 9.9 mg/dL (ref 8.9–10.3)
Chloride: 100 mmol/L (ref 98–111)
Creatinine, Ser: 0.82 mg/dL (ref 0.61–1.24)
GFR, Estimated: >60 mL/min (ref >60)
Glucose, Bld: 92 mg/dL (ref 70–99)
Potassium: 3.3 mmol/L — ABNORMAL LOW (ref 3.5–5.1)
Sodium: 136 mmol/L (ref 135–145)
Total Bilirubin: 0.9 mg/dL (ref 0.3–1.2)
Total Protein: 8.6 g/dL — ABNORMAL HIGH (ref 6.5–8.1)

## 2020-03-23 LAB — SALICYLATE LEVEL: Salicylate Lvl: <7 mg/dL — ABNORMAL LOW (ref 7.0–30.0)

## 2020-03-23 LAB — RESP PANEL BY RT-PCR (FLU A&B, COVID) ARPGX2
Influenza A by PCR: NEGATIVE
Influenza B by PCR: NEGATIVE
SARS Coronavirus 2 by RT PCR: NEGATIVE

## 2020-03-23 LAB — URINE DRUG SCREEN, QUALITATIVE (ARMC ONLY)
Amphetamines, Ur Screen: NOT DETECTED
Barbiturates, Ur Screen: NOT DETECTED
Benzodiazepine, Ur Scrn: NOT DETECTED
Cannabinoid 50 Ng, Ur ~~LOC~~: POSITIVE — AB
Cocaine Metabolite,Ur ~~LOC~~: POSITIVE — AB
MDMA (Ecstasy)Ur Screen: NOT DETECTED
Methadone Scn, Ur: NOT DETECTED
Opiate, Ur Screen: NOT DETECTED
Phencyclidine (PCP) Ur S: NOT DETECTED
Tricyclic, Ur Screen: NOT DETECTED

## 2020-03-23 LAB — ACETAMINOPHEN LEVEL: Acetaminophen (Tylenol), Serum: <10 ug/mL — ABNORMAL LOW (ref 10–30)

## 2020-03-23 LAB — CBC
HCT: 50.3 % (ref 39.0–52.0)
Hemoglobin: 16.4 g/dL (ref 13.0–17.0)
MCH: 27.7 pg (ref 26.0–34.0)
MCHC: 32.6 g/dL (ref 30.0–36.0)
MCV: 85.1 fL (ref 80.0–100.0)
Platelets: 373 10*3/uL (ref 150–400)
RBC: 5.91 MIL/uL — ABNORMAL HIGH (ref 4.22–5.81)
RDW: 12 % (ref 11.5–15.5)
WBC: 12.3 10*3/uL — ABNORMAL HIGH (ref 4.0–10.5)
nRBC: 0 % (ref 0.0–0.2)

## 2020-03-23 LAB — ETHANOL: Alcohol, Ethyl (B): <10 mg/dL (ref <10)

## 2020-03-23 NOTE — ED Notes (Signed)
Pt given phone to speak to family member.

## 2020-03-23 NOTE — ED Notes (Signed)
Pt has received dinner tray and is calm and relaxing at this time  lw edt

## 2020-03-23 NOTE — ED Notes (Signed)
PT  VOL MOVED  TO  [facility]  SEEN  BY  DR  [facility] MD  PENDING  PLACEMENT

## 2020-03-23 NOTE — ED Notes (Signed)
Patient moved to [facility], report to receiving nurse.

## 2020-03-23 NOTE — ED Notes (Signed)
Attempted to collect pt vitals. Pt sleeping, will try again at later time.

## 2020-03-23 NOTE — ED Notes (Signed)
Pt changed into hospital provided attire with this tech, [place], EDT and [place], NT in the rm. Pt belongings consist of white/black shoes, one black sock, one white sock, a white shirt, a pair of black jeans, blue boxers, a gray jacket, a black cell phone,a brown wallet with an [employer] card, 4 $20 dollar bills and 2 $1 dollar bills. Money was counted by Shawna Orleans, EDT and verified by this tech. Pt calm and cooperative while dressing out. Pt belongings placed into a pt belongings bag and labeled with pt name.

## 2020-03-23 NOTE — ED Provider Notes (Addendum)
[facility] Emergency Department Provider Note    First MD Initiated Contact with Patient 03/23/20 1524     (approximate)  I have reviewed the triage vital signs and the nursing notes.   HISTORY  Chief Complaint Psychiatric Evaluation    HPI Dylan Gay is a 24 y.o. male with the below listed past medical history presents to the ER voluntary with increasing audiovisual hallucinations some passive SI admitted substance abuse.  Is feeling depressed.  Does admit to  use of alcohol, cocaine and weed.  He denies any pain.  No nausea or vomiting.  No headaches.  No numbness or tingling.   History reviewed. No pertinent past medical history. No family history on file. History reviewed. No pertinent surgical history. Patient Active Problem List   Diagnosis Date Noted  . Cocaine abuse (HCC) 03/23/2020  . Severe major depression, single episode (HCC) 03/23/2020  . 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 Authorizing Provider  ibuprofen (ADVIL,MOTRIN) 400 MG tablet Take 1 tablet (400 mg total) by mouth every 6 (six) hours as needed for moderate pain. Patient not taking: Reported on 03/23/2020 08/26/15   Gayla Doss, MD  levETIRAcetam (KEPPRA) 500 MG tablet Take 1 tablet (500 mg total) by mouth 2 (two) times daily. Patient not taking: Reported on 03/23/2020 08/26/15   Gayla Doss, MD    Allergies Patient has no known allergies.    Social History Social History   Tobacco Use  . Smoking status: Former Games developer  . Smokeless tobacco: Never Used  Vaping Use  . Vaping Use: Never used  Substance Use Topics  . Alcohol use: Yes    Comment: not daily   . Drug use: Yes    Types: Marijuana, Cocaine    Review of Systems Patient denies headaches, rhinorrhea, blurry vision, numbness, shortness of breath, chest pain, edema, cough, abdominal pain,  nausea, vomiting, diarrhea, dysuria, fevers, rashes or hallucinations unless otherwise stated above in HPI. ____________________________________________   PHYSICAL EXAM:  VITAL SIGNS: Vitals:   03/23/20 1446  BP: (!) 140/97  Pulse: (!) 115  Resp: 20  Temp: 98.9 F (37.2 C)  SpO2: 97%    Constitutional: Alert and oriented.  Eyes: Conjunctivae are normal.  Head: Atraumatic. Nose: No congestion/rhinnorhea. Mouth/Throat: Mucous membranes are moist.   Neck: No stridor. Painless ROM.  Cardiovascular: Normal rate, regular rhythm. Grossly normal heart sounds.  Good peripheral circulation. Respiratory: Normal respiratory effort.  No retractions. Lungs CTAB. Gastrointestinal: Soft and nontender. No distention. No abdominal bruits. No CVA tenderness. Genitourinary:  Musculoskeletal: No lower extremity tenderness nor edema.  No joint effusions. Neurologic:  Normal speech and language. No gross focal neurologic deficits are appreciated. No facial droop Skin:  Skin is warm, dry and intact. No rash noted. Psychiatric: Mood and affect are normal. Speech and behavior are normal.  ____________________________________________   LABS (all labs ordered are listed, but only abnormal results are displayed)  Results for orders placed or performed during the hospital encounter of 03/23/20 (from the past 24 hour(s))  Urine Drug Screen, Qualitative     Status: Abnormal   Collection Time: 03/23/20  2:37 PM  Result Value Ref Range   Tricyclic, Ur Screen NONE DETECTED NONE DETECTED   Amphetamines, Ur Screen NONE DETECTED NONE DETECTED   MDMA (Ecstasy)Ur Screen NONE DETECTED NONE DETECTED   Cocaine Metabolite,Ur [place] POSITIVE (A) NONE DETECTED  Opiate, Ur Screen NONE DETECTED NONE DETECTED   Phencyclidine (PCP) Ur S NONE DETECTED NONE DETECTED   Cannabinoid 50 Ng, Ur [place] POSITIVE (A) NONE DETECTED   Barbiturates, Ur Screen NONE DETECTED NONE DETECTED   Benzodiazepine, Ur Scrn NONE DETECTED NONE  DETECTED   Methadone Scn, Ur NONE DETECTED NONE DETECTED  Comprehensive metabolic panel     Status: Abnormal   Collection Time: 03/23/20  3:08 PM  Result Value Ref Range   Sodium 136 135 - 145 mmol/L   Potassium 3.3 (L) 3.5 - 5.1 mmol/L   Chloride 100 98 - 111 mmol/L   CO2 26 22 - 32 mmol/L   Glucose, Bld 92 70 - 99 mg/dL   BUN 11 6 - 20 mg/dL   Creatinine, Ser 4.27 0.61 - 1.24 mg/dL   Calcium 9.9 8.9 - 06.2 mg/dL   Total Protein 8.6 (H) 6.5 - 8.1 g/dL   Albumin 4.8 3.5 - 5.0 g/dL   AST 22 15 - 41 U/L   ALT 22 0 - 44 U/L   Alkaline Phosphatase 87 38 - 126 U/L   Total Bilirubin 0.9 0.3 - 1.2 mg/dL   GFR, Estimated >37 >62 mL/min   Anion gap 10 5 - 15  Ethanol     Status: None   Collection Time: 03/23/20  3:08 PM  Result Value Ref Range   Alcohol, Ethyl (B) <10 <10 mg/dL  Salicylate level     Status: Abnormal   Collection Time: 03/23/20  3:08 PM  Result Value Ref Range   Salicylate Lvl <7.0 (L) 7.0 - 30.0 mg/dL  Acetaminophen level     Status: Abnormal   Collection Time: 03/23/20  3:08 PM  Result Value Ref Range   Acetaminophen (Tylenol), Serum <10 (L) 10 - 30 ug/mL  cbc     Status: Abnormal   Collection Time: 03/23/20  3:08 PM  Result Value Ref Range   WBC 12.3 (H) 4.0 - 10.5 K/uL   RBC 5.91 (H) 4.22 - 5.81 MIL/uL   Hemoglobin 16.4 13.0 - 17.0 g/dL   HCT 83.1 39 - 52 %   MCV 85.1 80.0 - 100.0 fL   MCH 27.7 26.0 - 34.0 pg   MCHC 32.6 30.0 - 36.0 g/dL   RDW 51.7 61.6 - 07.3 %   Platelets 373 150 - 400 K/uL   nRBC 0.0 0.0 - 0.2 %  Resp Panel by RT-PCR (Flu A&B, Covid) Nasopharyngeal Swab     Status: None   Collection Time: 03/23/20  3:48 PM   Specimen: Nasopharyngeal Swab; Nasopharyngeal(NP) swabs in vial transport medium  Result Value Ref Range   SARS Coronavirus 2 by RT PCR NEGATIVE NEGATIVE   Influenza A by PCR NEGATIVE NEGATIVE   Influenza B by PCR NEGATIVE NEGATIVE    ____________________________________________ ____________________________________________  RADIOLOGY   ____________________________________________   PROCEDURES  Procedure(s) performed:  Procedures    Critical Care performed: no ____________________________________________   INITIAL IMPRESSION / ASSESSMENT AND PLAN / ED COURSE  Pertinent labs & imaging results that were available during my care of the patient were reviewed by me and considered in my medical decision making (see chart for details).   DDX: Psychosis, delirium, medication effect, noncompliance, polysubstance abuse, Si, Hi, depression   Deaundre H Gabreal Worton is a 24 y.o. who presents to the ED with for evaluation of A/V hallucinations and SI.  Patient has psych history of substance induced psychosis.  Laboratory testing was ordered to evaluation for underlying electrolyte derangement or  signs of underlying organic pathology to explain today's presentation.  Based on history and physical and laboratory evaluation, it appears that the patient's presentation is 2/2 underlying psychiatric disorder and will require further evaluation and management by inpatient psychiatry.  Patient evaluated at bedside by Dr. Toni Amend,  Agrees with plan for admission to [facility].  The patient has been placed in psychiatric observation due to the need to provide a safe environment for the patient while obtaining psychiatric consultation and evaluation, as well as ongoing medical and medication management to treat the patient's condition.      The patient was evaluated in Emergency Department today for the symptoms described in the history of present illness. He/she was evaluated in the context of the global COVID-19 pandemic, which necessitated consideration that the patient might be at risk for infection with the SARS-CoV-2 virus that causes COVID-19. Institutional protocols and algorithms that pertain to the evaluation of patients at risk  for COVID-19 are in a state of rapid change based on information released by regulatory bodies including the CDC and federal and state organizations. These policies and algorithms were followed during the patient's care in the ED.  As part of my medical decision making, I reviewed the following data within the electronic MEDICAL RECORD NUMBER Nursing notes reviewed and incorporated, Labs reviewed, notes from prior ED visits and [place] Controlled Substance Database   ____________________________________________   FINAL CLINICAL IMPRESSION(S) / ED DIAGNOSES  Final diagnoses:  Polysubstance abuse (HCC)  Suicidal ideation      NEW MEDICATIONS STARTED DURING THIS VISIT:  New Prescriptions   No medications on file     Note:  This document was prepared using Dragon voice recognition software and may include unintentional dictation errors.    Willy Eddy, MD 03/23/20 1549    Willy Eddy, MD 03/23/20 2308

## 2020-03-23 NOTE — ED Notes (Signed)

## 2020-03-23 NOTE — ED Notes (Signed)
Pt's mother disclosed to this RN and interpretor that she fears for her and her other child's safety d/t pt behavior and hallucinations.

## 2020-03-23 NOTE — ED Triage Notes (Signed)
PT to ED from home with his mother. PT is here voluntarily with c/o A/V hallucinations as well as SI. PT cooperative. Interpretor present to translate for his mother. PT admits to use of alcohol, cocaine and weed.

## 2020-03-23 NOTE — Consult Note (Signed)
[facility]   Reason for Consult: Consult for 24 year old man presents voluntarily seeking treatment for depression Referring Physician: Roxan Hockeyobinson Patient Identification: Dylan Gay MRN:  [id number] Principal Diagnosis: Severe major depression, single episode (HCC) Diagnosis:  Principal Problem:   Severe major depression, single episode (HCC) Active Problems:   Cocaine abuse (HCC)   Total Time spent with patient: 1 hour  Subjective:   Dylan Gay is a 24 y.o. male patient admitted with "I do not feel like myself".  HPI: Patient seen chart reviewed.  24 year old man came voluntarily to the hospital.  He says for the past several weeks he has not been feeling right.  He feels sad empty anxious all the time.  He cannot sleep well at night.  He has not eaten in several days.  He says that he is occasionally seeing things although he cannot very easily describe them.  He has been having suicidal thoughts such as driving his car recklessly.  Denies homicidal ideation.  Patient only started using powder cocaine about a month ago by his report.  Has been using pretty frequently since then.  Drinks only occasionally.  Also using marijuana.  Patient is cooperative with exam and requesting treatment.  He was here the last week of November in the emergency room and was not admitted and has not had any outpatient treatment  Past Psychiatric History: No previous hospitalization.  No history of suicide attempts.  No history of violence.  Seen in the emergency room previously and referred for outpatient treatment but has not followed up.  Risk to Self:   Risk to Others:   Prior Inpatient Therapy:   Prior Outpatient Therapy:    Past Medical History: History reviewed. No pertinent past medical history. History reviewed. No pertinent surgical history. Family History: No family history on file. Family Psychiatric  History: Reports that his mother has  problems with depression.  No family history of suicide. Social History:  Social History   Substance and Sexual Activity  Alcohol Use Yes   Comment: not daily      Social History   Substance and Sexual Activity  Drug Use Yes  . Types: Marijuana, Cocaine    Social History   Socioeconomic History  . Marital status: Single    Spouse name: Not on file  . Number of children: Not on file  . Years of education: Not on file  . Highest education level: Not on file  Occupational History  . Not on file  Tobacco Use  . Smoking status: Former Games developermoker  . Smokeless tobacco: Never Used  Vaping Use  . Vaping Use: Never used  Substance and Sexual Activity  . Alcohol use: Yes    Comment: not daily   . Drug use: Yes    Types: Marijuana, Cocaine  . Sexual activity: Not on file  Other Topics Concern  . Not on file  Social History Narrative  . Not on file   Social Determinants of Health   Financial Resource Strain:   . Difficulty of Paying Living Expenses: Not on file  Food Insecurity:   . Worried About Programme researcher, broadcasting/film/videounning Out of Food in the Last Year: Not on file  . Ran Out of Food in the Last Year: Not on file  Transportation Needs:   . Lack of Transportation (Medical): Not on file  . Lack of Transportation (Non-Medical): Not on file  Physical Activity:   . Days of Exercise per Week: Not on file  .  Minutes of Exercise per Session: Not on file  Stress:   . Feeling of Stress : Not on file  Social Connections:   . Frequency of Communication with Friends and Family: Not on file  . Frequency of Social Gatherings with Friends and Family: Not on file  . Attends Religious Services: Not on file  . Active Member of Clubs or Organizations: Not on file  . Attends Banker Meetings: Not on file  . Marital Status: Not on file   Additional Social History:    Allergies:  No Known Allergies  Labs:  Results for orders placed or performed during the hospital encounter of 03/23/20 (from  the past 48 hour(s))  Urine Drug Screen, Qualitative     Status: Abnormal   Collection Time: 03/23/20  2:37 PM  Result Value Ref Range   Tricyclic, Ur Screen NONE DETECTED NONE DETECTED   Amphetamines, Ur Screen NONE DETECTED NONE DETECTED   MDMA (Ecstasy)Ur Screen NONE DETECTED NONE DETECTED   Cocaine Metabolite,Ur [place] POSITIVE (A) NONE DETECTED   Opiate, Ur Screen NONE DETECTED NONE DETECTED   Phencyclidine (PCP) Ur S NONE DETECTED NONE DETECTED   Cannabinoid 50 Ng, Ur [place] POSITIVE (A) NONE DETECTED   Barbiturates, Ur Screen NONE DETECTED NONE DETECTED   Benzodiazepine, Ur Scrn NONE DETECTED NONE DETECTED   Methadone Scn, Ur NONE DETECTED NONE DETECTED    Comment: (NOTE) Tricyclics + metabolites, urine    Cutoff 1000 ng/mL Amphetamines + metabolites, urine  Cutoff 1000 ng/mL MDMA (Ecstasy), urine              Cutoff 500 ng/mL Cocaine Metabolite, urine          Cutoff 300 ng/mL Opiate + metabolites, urine        Cutoff 300 ng/mL Phencyclidine (PCP), urine         Cutoff 25 ng/mL Cannabinoid, urine                 Cutoff 50 ng/mL Barbiturates + metabolites, urine  Cutoff 200 ng/mL Benzodiazepine, urine              Cutoff 200 ng/mL Methadone, urine                   Cutoff 300 ng/mL  The urine drug screen provides only a preliminary, unconfirmed analytical test result and should not be used for non-medical purposes. Clinical consideration and professional judgment should be applied to any positive drug screen result due to possible interfering substances. A more specific alternate chemical method must be used in order to obtain a confirmed analytical result. Gas chromatography / mass spectrometry (GC/MS) is the preferred confirm atory method. Performed at [facility], [address] [place], [place] [address]   Comprehensive metabolic panel     Status: Abnormal   Collection Time: 03/23/20  3:08 PM  Result Value Ref Range   Sodium 136 135 - 145 mmol/L   Potassium 3.3  (L) 3.5 - 5.1 mmol/L   Chloride 100 98 - 111 mmol/L   CO2 26 22 - 32 mmol/L   Glucose, Bld 92 70 - 99 mg/dL    Comment: Glucose reference range applies only to samples taken after fasting for at least 8 hours.   BUN 11 6 - 20 mg/dL   Creatinine, Ser 1.76 0.61 - 1.24 mg/dL   Calcium 9.9 8.9 - 16.0 mg/dL   Total Protein 8.6 (H) 6.5 - 8.1 g/dL   Albumin 4.8 3.5 - 5.0  g/dL   AST 22 15 - 41 U/L   ALT 22 0 - 44 U/L   Alkaline Phosphatase 87 38 - 126 U/L   Total Bilirubin 0.9 0.3 - 1.2 mg/dL   GFR, Estimated >67 >61 mL/min    Comment: (NOTE) Calculated using the CKD-EPI Creatinine Equation (2021)    Anion gap 10 5 - 15    Comment: Performed at [facility], [address] [place], [place] [address]  Ethanol     Status: None   Collection Time: 03/23/20  3:08 PM  Result Value Ref Range   Alcohol, Ethyl (B) <10 <10 mg/dL    Comment: (NOTE) Lowest detectable limit for serum alcohol is 10 mg/dL.  For medical purposes only. Performed at [facility], [address] [place], [place] [address]   Salicylate level     Status: Abnormal   Collection Time: 03/23/20  3:08 PM  Result Value Ref Range   Salicylate Lvl <7.0 (L) 7.0 - 30.0 mg/dL    Comment: Performed at [facility], [address] [place], [place] [address]  Acetaminophen level     Status: Abnormal   Collection Time: 03/23/20  3:08 PM  Result Value Ref Range   Acetaminophen (Tylenol), Serum <10 (L) 10 - 30 ug/mL    Comment: (NOTE) Therapeutic concentrations vary significantly. A range of 10-30 ug/mL  may be an effective concentration for many patients. However, some  are best treated at concentrations outside of this range. Acetaminophen concentrations >150 ug/mL at 4 hours after ingestion  and >50 ug/mL at 12 hours after ingestion are often associated with  toxic reactions.  Performed at [facility], [address] [place], [place] [address]   cbc     Status: Abnormal    Collection Time: 03/23/20  3:08 PM  Result Value Ref Range   WBC 12.3 (H) 4.0 - 10.5 K/uL   RBC 5.91 (H) 4.22 - 5.81 MIL/uL   Hemoglobin 16.4 13.0 - 17.0 g/dL   HCT 25.0 39 - 52 %   MCV 85.1 80.0 - 100.0 fL   MCH 27.7 26.0 - 34.0 pg   MCHC 32.6 30.0 - 36.0 g/dL   RDW 53.9 76.7 - 34.1 %   Platelets 373 150 - 400 K/uL   nRBC 0.0 0.0 - 0.2 %    Comment: Performed at [facility], [address] [place], [place] [address]    No current facility-administered medications for this encounter.   Current Outpatient Medications  Medication Sig Dispense Refill  . ibuprofen (ADVIL,MOTRIN) 400 MG tablet Take 1 tablet (400 mg total) by mouth every 6 (six) hours as needed for moderate pain. 20 tablet 0  . levETIRAcetam (KEPPRA) 500 MG tablet Take 1 tablet (500 mg total) by mouth 2 (two) times daily. 60 tablet 1    Musculoskeletal: Strength & Muscle Tone: within normal limits Gait & Station: normal Patient leans: N/A  Psychiatric Specialty Exam: Physical Exam Vitals and nursing note reviewed.  Constitutional:      Appearance: He is well-developed.  HENT:     Head: Normocephalic and atraumatic.  Eyes:     Conjunctiva/sclera: Conjunctivae normal.     Pupils: Pupils are equal, round, and reactive to light.  Cardiovascular:     Heart sounds: Normal heart sounds.  Pulmonary:     Effort: Pulmonary effort is normal.  Abdominal:     Palpations: Abdomen is soft.  Musculoskeletal:        General: Normal range  of motion.     Cervical back: Normal range of motion.  Skin:    General: Skin is warm and dry.  Neurological:     General: No focal deficit present.     Mental Status: He is alert.  Psychiatric:        Attention and Perception: Attention normal.        Mood and Affect: Mood is anxious and depressed.        Speech: Speech is delayed.        Behavior: Behavior is withdrawn.        Thought Content: Thought content includes suicidal ideation. Thought content includes  suicidal plan.        Cognition and Memory: Memory is impaired.        Judgment: Judgment is impulsive.     Review of Systems  Constitutional: Negative.   HENT: Negative.   Eyes: Negative.   Respiratory: Negative.   Cardiovascular: Negative.   Gastrointestinal: Negative.   Musculoskeletal: Negative.   Skin: Negative.   Neurological: Negative.   Psychiatric/Behavioral: Positive for dysphoric mood, hallucinations, sleep disturbance and suicidal ideas.    Blood pressure (!) 140/97, pulse (!) 115, temperature 98.9 F (37.2 C), temperature source Oral, resp. rate 20, height 5\' 5"  (1.651 m), weight 88.5 kg, SpO2 97 %.Body mass index is 32.45 kg/m.  General Appearance: Casual  Eye Contact:  Minimal  Speech:  Clear and Coherent  Volume:  Decreased  Mood:  Depressed and Dysphoric  Affect:  Depressed  Thought Process:  Coherent  Orientation:  Full (Time, Place, and Person)  Thought Content:  Rumination and Tangential  Suicidal Thoughts:  Yes.  with intent/plan  Homicidal Thoughts:  No  Memory:  Immediate;   Fair Recent;   Fair Remote;   Fair  Judgement:  Fair  Insight:  Fair  Psychomotor Activity:  Decreased  Concentration:  Concentration: Poor  Recall:  of Knowledge:  Fair  Language:  Fair  Akathisia:  No  Handed:  Right  AIMS (if indicated):     Assets:  Communication Skills Desire for Improvement Housing Physical Health Resilience Social Support  ADL's:  Impaired  Cognition:  WNL  Sleep:        Treatment Plan Summary: Daily contact with patient to assess and evaluate symptoms and progress in treatment, Medication management and Plan 24 year old man presents the emergency room with major depression severely depressed mood low self-esteem very poor sleep very poor appetite suicidal ideation but insight into needing help.  Coincidentally has been using powder cocaine multiple times a week for the last several weeks.  Also marijuana frequently.  Patient is  currently calm and cooperative although he looks very depressed.  Plan will be for admission to the psychiatric unit for further evaluation and treatment.  Case reviewed with emergency room physician and psychiatry staff.  Patient agreeable to plan.  Disposition: Recommend psychiatric Inpatient admission when medically cleared.  25, MD 03/23/2020 4:27 PM

## 2020-03-24 ENCOUNTER — Inpatient Hospital Stay
Admission: AD | Admit: 2020-03-24 | Discharge: 2020-03-26 | DRG: 885 | Disposition: A | Discharge status: Home / Self Care | Payer: No Typology Code available for payment source | Source: Intra-hospital | Attending: Behavioral Health | Admitting: Behavioral Health

## 2020-03-24 ENCOUNTER — Other Ambulatory Visit: Payer: Self-pay

## 2020-03-24 ENCOUNTER — Encounter: Payer: Self-pay | Admitting: Psychiatry

## 2020-03-24 DIAGNOSIS — G4700 Insomnia, unspecified: Secondary | ICD-10-CM | POA: Diagnosis present

## 2020-03-24 DIAGNOSIS — F22 Delusional disorders: Secondary | ICD-10-CM | POA: Diagnosis present

## 2020-03-24 DIAGNOSIS — F14151 Cocaine abuse with cocaine-induced psychotic disorder with hallucinations: Secondary | ICD-10-CM | POA: Diagnosis present

## 2020-03-24 DIAGNOSIS — F14959 Cocaine use, unspecified with cocaine-induced psychotic disorder, unspecified: Secondary | ICD-10-CM | POA: Diagnosis present

## 2020-03-24 DIAGNOSIS — R45851 Suicidal ideations: Secondary | ICD-10-CM | POA: Diagnosis present

## 2020-03-24 DIAGNOSIS — F419 Anxiety disorder, unspecified: Secondary | ICD-10-CM | POA: Diagnosis present

## 2020-03-24 DIAGNOSIS — F1410 Cocaine abuse, uncomplicated: Secondary | ICD-10-CM | POA: Diagnosis present

## 2020-03-24 DIAGNOSIS — F322 Major depressive disorder, single episode, severe without psychotic features: Secondary | ICD-10-CM | POA: Diagnosis present

## 2020-03-24 DIAGNOSIS — A749 Chlamydial infection, unspecified: Secondary | ICD-10-CM | POA: Diagnosis present

## 2020-03-24 DIAGNOSIS — Z87891 Personal history of nicotine dependence: Secondary | ICD-10-CM | POA: Diagnosis not present

## 2020-03-24 DIAGNOSIS — Z79899 Other long term (current) drug therapy: Secondary | ICD-10-CM

## 2020-03-24 DIAGNOSIS — Z20822 Contact with and (suspected) exposure to covid-19: Secondary | ICD-10-CM | POA: Diagnosis present

## 2020-03-24 DIAGNOSIS — Z818 Family history of other mental and behavioral disorders: Secondary | ICD-10-CM | POA: Diagnosis not present

## 2020-03-24 DIAGNOSIS — F19959 Other psychoactive substance use, unspecified with psychoactive substance-induced psychotic disorder, unspecified: Secondary | ICD-10-CM | POA: Diagnosis present

## 2020-03-24 LAB — URINALYSIS, ROUTINE W REFLEX MICROSCOPIC
Bilirubin Urine: NEGATIVE
Glucose, UA: NEGATIVE mg/dL
Hgb urine dipstick: NEGATIVE
Ketones, ur: NEGATIVE mg/dL
Leukocytes,Ua: NEGATIVE
Nitrite: NEGATIVE
Protein, ur: NEGATIVE mg/dL
Specific Gravity, Urine: 1.03 (ref 1.005–1.030)
pH: 5 (ref 5.0–8.0)

## 2020-03-24 LAB — CHLAMYDIA/NGC RT PCR (ARMC ONLY)
Chlamydia Tr: DETECTED — AB
N gonorrhoeae: NOT DETECTED

## 2020-03-24 MED ORDER — ACETAMINOPHEN 325 MG PO TABS: 650.0000 mg | ORAL_TABLET | Freq: Four times a day (QID) | ORAL | Status: DC | PRN

## 2020-03-24 MED ORDER — MAGNESIUM HYDROXIDE 400 MG/5ML PO SUSP: 30.0000 mL | Freq: Every day | ORAL | Status: DC | PRN

## 2020-03-24 MED ORDER — HYDROXYZINE HCL 50 MG PO TABS
50.0000 mg | ORAL_TABLET | Freq: Three times a day (TID) | ORAL | Status: DC | PRN
  Administered 2020-03-24 – 2020-03-25 (×2): 50 mg via ORAL
  Filled 2020-03-24 (×3): qty 1

## 2020-03-24 MED ORDER — QUETIAPINE FUMARATE 25 MG PO TABS
50.0000 mg | ORAL_TABLET | Freq: Every day | ORAL | Status: DC
  Administered 2020-03-24 – 2020-03-25 (×2): 50 mg via ORAL
  Filled 2020-03-24 (×2): qty 2

## 2020-03-24 MED ORDER — TRAZODONE HCL 100 MG PO TABS: 100.0000 mg | ORAL_TABLET | Freq: Every evening | ORAL | Status: DC | PRN

## 2020-03-24 MED ORDER — ALUM & MAG HYDROXIDE-SIMETH 200-200-20 MG/5ML PO SUSP: 30.0000 mL | ORAL | Status: DC | PRN

## 2020-03-24 NOTE — BH Assessment (Signed)
Assessment Note  Dylan Gay is an 24 y.o. male who presents to [facility] ED voluntarily for treatment. Per triage note, PT to ED from home with his mother. PT is here voluntarily with c/o A/V hallucinations as well as SI. PT cooperative. Interpreter present to translate for his mother. PT admits to use of alcohol, cocaine and weed.    During TTS assessment pt presents alert and oriented x 4, restless but cooperative, and mood-congruent with affect. The pt does not appear to be responding to internal or external stimuli. Pt verified the information provided to triage RN.Pt identifies his main complaints to be depression and stress. Pt reports SA. Pt believes he may have encountered some "bad" cocaine. Pt reports experiencing AH/VH about 2 weeks ago. Pt states he smokes marijuana daily to keep him calm. Pt reports no INPT hx and OPT hx with [facility]. Pt denies SI/HI. Pt contracts for safety.      Diagnosis: Severe major depression  Past Medical History: History reviewed. No pertinent past medical history.  History reviewed. No pertinent surgical history.  Family History: No family history on file.  Social History:  reports that he has quit smoking. He has never used smokeless tobacco. He reports current alcohol use. He reports current drug use. Drugs: Marijuana and Cocaine.  Additional Social History:  Alcohol / Drug Use Pain Medications: See MAR Prescriptions: See MAR Over the Counter: See MAR History of alcohol / drug use?: Yes Substance #1 Name of Substance 1: Marijuana 1 - Age of First Use: Unknown 1 - Amount (size/oz): edibles Substance #2 Name of Substance 2: Cocaine 2 - Age of First Use: Unknown 2 - Amount (size/oz): Unknwon 2 - Frequency: "a few times"  CIWA: CIWA-Ar BP: (!) 140/97 Pulse Rate: (!) 115 COWS:    Allergies: No Known Allergies  Home Medications: (Not in a hospital admission)   OB/GYN Status:  No LMP for male patient.  General Assessment  Data Location of Assessment: [facility] ED TTS Assessment: In system Is this a Tele or Face-to-Face Assessment?: Face-to-Face Is this an Initial Assessment or a Re-assessment for this encounter?: Initial Assessment Patient Accompanied by:: N/A Language Other than English: No Living Arrangements: Other (Comment) (Lives with mom and brother) What gender do you identify as?: Male Date Telepsych consult ordered in [facility]: 03/23/20 Time Telepsych consult ordered in [facility]: 1851 Marital status: Single Maiden name: N/A Pregnancy Status: No Living Arrangements: Parent (Lives with mom and brother) Can pt return to current living arrangement?: Yes Admission Status: Pharmacist, hospital:  (None reported) Is patient capable of signing voluntary admission?: Yes Referral Source: Other Insurance type: None  Medical Screening Exam [facility] Walk-in ONLY) Medical Exam completed: Yes  Crisis Care Plan Living Arrangements: Parent (Lives with mom and brother) Legal Guardian:  (Self) Name of Psychiatrist: Reports of none Name of Therapist: Reports of none  Education Status Is patient currently in school?: No Is the patient employed, 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 : No Suicidal Intent: No Has patient had any suicidal intent within the past 6 months prior to admission? : No Is patient at risk for suicide?: No Suicidal Plan?: No Has patient had any suicidal plan within the past 6 months prior to admission? : No Access to Means: No What has been your use of drugs/alcohol within the last 12 months?: Marijuana  and cocaine Previous Attempts/Gestures: No How many times?: 0 Other Self Harm Risks: None reported Triggers for Past Attempts: Hallucinations Intentional Self Injurious Behavior: None Family Suicide History: Unknown Recent stressful life  event(s): Other (Comment) (Drug use) Persecutory voices/beliefs?: Yes (Hearing voices in his head) Depression: Yes Depression Symptoms: Insomnia, Despondent Substance abuse history and/or treatment for substance abuse?: Yes Suicide prevention information given to non-admitted patients: Not applicable  Risk to Others within the past 6 months Homicidal Ideation: No Does patient have any lifetime risk of violence toward others beyond the six months prior to admission? : No Thoughts of Harm to Others: No Current Homicidal Intent: No Current Homicidal Plan: No Access to Homicidal Means: No Identified Victim: None reported History of harm to others?: No Assessment of Violence: None Noted Violent Behavior Description: None reported Does patient have access to weapons?: No Criminal Charges Pending?: No Does patient have a court date: No Is patient on probation?: No  Psychosis Hallucinations: Auditory, Visual Delusions: Persecutory  Mental Status Report Appearance/Hygiene: In scrubs Eye Contact: Fair Motor Activity: Freedom of movement Speech: Logical/coherent Level of Consciousness: Alert, Quiet/awake Mood: Depressed, Anxious Affect: Anxious, Preoccupied Anxiety Level: Moderate Thought Processes: Coherent, Relevant Judgement: Unimpaired Orientation: Person, Place, Time, Situation, Appropriate for developmental age  Cognitive Functioning Concentration: Decreased Memory: Recent Intact, Remote Intact Is patient IDD: No Insight: Poor Impulse Control: Fair Appetite: Good Have you had any weight changes? : No Change Sleep: Decreased Total Hours of Sleep: 2 Vegetative Symptoms: None  ADLScreening [facility] Assessment Services) Patient's cognitive ability adequate to safely complete daily activities?: Yes Patient able to express need for assistance with ADLs?: Yes Independently performs ADLs?: Yes (appropriate for developmental age)  Prior Inpatient Therapy Prior Inpatient  Therapy: No  Prior Outpatient Therapy Prior Outpatient Therapy: Yes ([facility]) Prior Therapy Dates: Unknown Prior Therapy Facilty/Provider(s): Unknown Reason for Treatment: Drug use and depression Does patient have an ACCT team?: No Does patient have Intensive In-House Services?  : No Does patient have [facility] services? : No Does patient have P4CC services?: No  ADL Screening (condition at time of admission) Patient's cognitive ability adequate to safely complete daily activities?: Yes Is the patient deaf or have difficulty hearing?: No Does the patient have difficulty seeing, even when wearing glasses/contacts?: No Does the patient have difficulty concentrating, remembering, or making decisions?: No Patient able to express need for assistance with ADLs?: Yes Does the patient have difficulty dressing or bathing?: No Independently performs ADLs?: Yes (appropriate for developmental age) Does the patient have difficulty walking or climbing stairs?: No Weakness of Legs: None Weakness of Arms/Hands: None  Home Assistive Devices/Equipment Home Assistive Devices/Equipment: None  Therapy Consults (therapy consults require a physician order) PT Evaluation Needed: No OT Evalulation Needed: No SLP Evaluation Needed: No Abuse/Neglect Assessment (Assessment to be complete while patient is alone) Abuse/Neglect Assessment Can Be Completed: Yes Physical Abuse: Denies Verbal Abuse: Denies Sexual Abuse: Denies Exploitation of patient/patient's resources: Denies Self-Neglect: Denies Values / Beliefs Cultural Requests During Hospitalization: None Spiritual Requests During Hospitalization: None Consults Spiritual Care Consult Needed: No Transition of Care Team Consult Needed: No Advance Directives (For Healthcare) Does Patient Have a Medical Advance Directive?: No Would patient like information on creating a medical advance directive?: No - Patient declined          Disposition:   Disposition Initial Assessment Completed for this Encounter: Yes  On Site Evaluation by:   Reviewed with Physician:    Clerance Lav 03/24/2020 6:18 AM

## 2020-03-24 NOTE — Tx Team (Signed)
Initial Treatment Plan 03/24/2020 1:32 PM Waylen Percell [place] [id number]    PATIENT STRESSORS: Marital or family conflict Substance abuse   PATIENT STRENGTHS: Motivation for treatment/growth Supportive family/friends   PATIENT IDENTIFIED PROBLEMS: Ineffective coping skills  Substance abuse                   DISCHARGE CRITERIA:  Improved stabilization in mood, thinking, and/or behavior Motivation to continue treatment in a less acute level of care  PRELIMINARY DISCHARGE PLAN: Attend 12-step recovery group Outpatient therapy  PATIENT/FAMILY INVOLVEMENT: This treatment plan has been presented to and reviewed with the patient, Candice Lunney, and/or family member.  The patient and family have been given the opportunity to ask questions and make suggestions.  Sharin Mons, RN 03/24/2020, 1:32 PM

## 2020-03-24 NOTE — B[facility] Suicide Risk Assessment (Signed)
[facility] Admission Suicide Risk Assessment   Nursing information obtained from:  Patient Demographic factors:  Male, Low socioeconomic status Current Mental Status:  NA Loss Factors:  Loss of significant relationship, Decline in physical health, Legal issues, Financial problems / change in socioeconomic status Historical Factors:  NA Risk Reduction Factors:  Living with another person, especially a relative, Employed  Total Time spent with patient: 45 minutes Principal Problem: <principal problem not specified> Diagnosis:  Active Problems:   Severe major depression, single episode (HCC)  Subjective Data: Patient is a 24 year old male who presented voluntarily to the hospital for treatment of depression, hallucinations, and suicidal thoughts. He recently began using cocaine about a month ago, and starting 2-3 weeks ago began getting very paranoid. He feels he hears people breaking into his home, shadows on the walls, and feels someone is out to get him. He feels there is a tracker in his body, and requests a full-body scan to assess for them. He also notes there is a new green light on his phone that has never been there before, and proves someone is tracking him to harm him. He has not been sleeping well at night, and cites this is because he fears someone will kill him when he falls asleep. Prior to cocaine use he also endorsed several symptoms of depression including poor sleep, poor appetite, low energy, anhedonia, hopelessness, and suicidal thoughts. He also expresses ongoing anxiety. He has never been on psychiatric medications or been in therapy before. However, he feels he would benefit from medications. RBA of seroquel discussed with patient, and he is agreeable to trying this medication for mood, hallucinations, insomnia, and poor appetite.   Of note, patient also mentions concern about possible penile discharge. He states he had unprotected sex with numerous partners, and requests a full STD  workup for gonorrhea, chlamydia, HIV, and syphilis.   Continued Clinical Symptoms:  Alcohol Use Disorder Identification Test Final Score (AUDIT): 5 The "Alcohol Use Disorders Identification Test", Guidelines for Use in Primary Care, Second Edition.  World Science writer [facility]). Score between 0-7:  no or low risk or alcohol related problems. Score between 8-15:  moderate risk of alcohol related problems. Score between 16-19:  high risk of alcohol related problems. Score 20 or above:  warrants further diagnostic evaluation for alcohol dependence and treatment.   CLINICAL FACTORS:   Severe Anxiety and/or Agitation Dysthymia Alcohol/Substance Abuse/Dependencies Currently Psychotic Unstable or Poor Therapeutic Relationship Previous Psychiatric Diagnoses and Treatments   Musculoskeletal: Strength & Muscle Tone: within normal limits Gait & Station: normal Patient leans: N/A  Psychiatric Specialty Exam: Physical Exam Vitals and nursing note reviewed.  Constitutional:      Appearance: Normal appearance.  HENT:     Head: Normocephalic and atraumatic.     Right Ear: External ear normal.     Left Ear: External ear normal.     Nose: Nose normal.     Mouth/Throat:     Mouth: Mucous membranes are moist.     Pharynx: Oropharynx is clear.  Eyes:     Extraocular Movements: Extraocular movements intact.     Conjunctiva/sclera: Conjunctivae normal.     Pupils: Pupils are equal, round, and reactive to light.  Cardiovascular:     Rate and Rhythm: Normal rate.     Pulses: Normal pulses.  Pulmonary:     Effort: Pulmonary effort is normal.     Breath sounds: Normal breath sounds.  Abdominal:     General: Abdomen is flat.  Palpations: Abdomen is soft.  Musculoskeletal:        General: No swelling. Normal range of motion.     Cervical back: Normal range of motion and neck supple.  Skin:    General: Skin is warm and dry.  Neurological:     General: No focal deficit present.      Mental Status: He is alert and oriented to person, place, and time.  Psychiatric:        Attention and Perception: He perceives auditory and visual hallucinations.        Mood and Affect: Mood is anxious.        Speech: Speech normal.        Behavior: Behavior is cooperative.        Thought Content: Thought content is paranoid and delusional. Thought content includes suicidal ideation.        Cognition and Memory: Cognition and memory normal.        Judgment: Judgment is impulsive.     Review of Systems  Constitutional: Positive for appetite change and fatigue. Negative for activity change.  HENT: Negative for rhinorrhea and sore throat.   Eyes: Negative for photophobia and visual disturbance.  Respiratory: Negative for cough and shortness of breath.   Cardiovascular: Negative for chest pain and palpitations.  Gastrointestinal: Negative for constipation, diarrhea, nausea and vomiting.  Endocrine: Negative for cold intolerance and heat intolerance.  Genitourinary: Positive for discharge and dysuria.  Musculoskeletal: Negative for arthralgias and back pain.  Skin: Negative for rash and wound.  Allergic/Immunologic: Negative for environmental allergies and food allergies.  Neurological: Negative for dizziness and headaches.  Hematological: Negative for adenopathy. Does not bruise/bleed easily.  Psychiatric/Behavioral: Positive for confusion, decreased concentration, dysphoric mood, hallucinations, sleep disturbance and suicidal ideas. The patient is nervous/anxious.     Blood pressure (!) 132/91, pulse 95, temperature 98.4 F (36.9 C), temperature source Oral, resp. rate 18, height 5\' 5"  (1.651 m), weight 83 kg, SpO2 97 %.Body mass index is 30.45 kg/m.  General Appearance: Fairly Groomed  Eye Contact:  Fair  Speech:  Clear and Coherent  Volume:  Normal  Mood:  Anxious  Affect:  Congruent  Thought Process:  Disorganized  Orientation:  Full (Time, Place, and Person)  Thought Content:   Delusions, Hallucinations: Auditory Visual and Paranoid Ideation  Suicidal Thoughts:  Yes.  without intent/plan  Homicidal Thoughts:  No  Memory:  Immediate;   Fair Recent;   Fair Remote;   Fair  Judgement:  Intact  Insight:  Lacking  Psychomotor Activity:  Restlessness  Concentration:  Concentration: Fair and Attention Span: Fair  Recall:  of Knowledge:  Fair  Language:  Fair  Akathisia:  Negative  Handed:  Right  AIMS (if indicated):     Assets:  Communication Skills Desire for Improvement Financial Resources/Insurance Housing Physical Health Resilience Social Support Talents/Skills Transportation Vocational/Educational  ADL's:  Intact  Cognition:  Impaired,  Mild  Sleep:         COGNITIVE FEATURES THAT CONTRIBUTE TO RISK:  Loss of executive function    SUICIDE RISK:   Moderate:  Frequent suicidal ideation with limited intensity, and duration, some specificity in terms of plans, no associated intent, good self-control, limited dysphoria/symptomatology, some risk factors present, and identifiable protective factors, including available and accessible social support.  PLAN OF CARE: Daily contact with patient to assess and evaluate symptoms and progress in treatment, Medication management and Plan 24 year old man presents the emergency room with major depressiond  low self-esteem, very poor sleep, very poor appetite, suicidal ideation, visual hallucinations, and auditory hallucination. He also endorses paranoia of tracking device being in his body, and also on his phone. Will start Seroquel 50 mg QHS for mood, hallucinations, and paranoia. Hydroxyzine PRN for anxiety.    I certify that inpatient services furnished can reasonably be expected to improve the patient's condition.   Jesse Sans, MD 03/24/2020, 2:42 PM

## 2020-03-24 NOTE — BH Assessment (Addendum)
TTS received verbal consent to make contact with pt's family support.  Using [employer] , TTS made contact with pt's mom Darien Ramus Mordecai Maes [phone]) to provide an update on his current disposition and presentation. Ms. Mordecai Maes reports to understand pt's current dispositions, BMU policies, meals offered, and pt's ability to make contact with her by phone only during treatment. TTS also, provided Ms. Mordecai Maes the number to the unit per her request. Ms. Mordecai Maes expressed no further concerns/questions at this time.

## 2020-03-24 NOTE — Plan of Care (Signed)
Patient was cooperative with treatment, medication compliant, pleasant on approach, he denies AVH. He was visible in the milieu acting appropriately with peers and staff. He is currently in bed resting quietly.

## 2020-03-24 NOTE — BH Assessment (Addendum)
Patient can come down NOW  Call to give report: [phone]  Patient is to be admitted to [facility] by Dr. Neale Burly.  Attending Physician will be. Dr. Neale Burly.   Patient has been assigned to room 303, by [facility] Charge Nurse Maryelizabeth Kaufmann, RN.   Intake Paper Work has been signed and placed on patient chart.  ER staff is aware of the admission: 1. Misty Stanley, ER Secretary  2. Erma Heritage, ER MD  3. Amy T. Patient's Nurse  4. THO Patient Access  Pt signed volunteer form for BMU

## 2020-03-24 NOTE — BHH Group Notes (Signed)
LCSW Group Therapy Note  03/24/2020 1:41 PM  Type of Therapy/Topic:  Group Therapy:  Emotion Regulation  Participation Level:  Did Not Attend   Description of Group:   The purpose of this group is to assist patients in learning to regulate negative emotions and experience positive emotions. Patients will be guided to discuss ways in which they have been vulnerable to their negative emotions. These vulnerabilities will be juxtaposed with experiences of positive emotions or situations, and patients will be challenged to use positive emotions to combat negative ones. Special emphasis will be placed on coping with negative emotions in conflict situations, and patients will process healthy conflict resolution skills.  Therapeutic Goals: 1. Patient will identify two positive emotions or experiences to reflect on in order to balance out negative emotions 2. Patient will label two or more emotions that they find the most difficult to experience 3. Patient will demonstrate positive conflict resolution skills through discussion and/or role plays  Summary of Patient Progress:  X  Therapeutic Modalities:   Cognitive Behavioral Therapy Feelings Identification Dialectical Behavioral Therapy  Penni Homans, MSW, LCSW 03/24/2020 1:41 PM

## 2020-03-24 NOTE — ED Notes (Signed)
He is sleeping  VS to be obtained when he awakens  

## 2020-03-24 NOTE — Progress Notes (Signed)
Pt is a 24 yr old male that presented voluntarily through the ED with the c/o AVH and SI. During assessment pt is anxious/cooperative and denies SI/HI/AVH. Pt reports stressors as being his home life and the past memories that it bring forward that he has difficulty dealing with. Pt also reports the new onset of AVH such as the screaming he hears while showering as a stressor. Pt shares he has no desire to quite smoking marjuana however does want help with his cocaine use.   A small sack of cocaine was found in the pt's belongings, the director was notified, and the cocaine was properly disposed of in front of security.   Skin assessment was preformed upon admission. Skin is intact and without injury.

## 2020-03-24 NOTE — ED Notes (Addendum)
Called to the [facility]  - pt is in the computer and is ready to transfer   No voluntary form has been signed so nurse requests that she call TTS and then call me back  Pt is out of the ED system but I must await a return call [phone]   Received a call back at [phone]  Pt to transfer
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
## 2020-03-25 LAB — HEMOGLOBIN A1C
Hgb A1c MFr Bld: 5.5 % (ref 4.8–5.6)
Mean Plasma Glucose: 111.15 mg/dL

## 2020-03-25 LAB — HEPATITIS PANEL, ACUTE
HCV Ab: NONREACTIVE
Hep A IgM: NONREACTIVE
Hep B C IgM: NONREACTIVE
Hepatitis B Surface Ag: NONREACTIVE

## 2020-03-25 LAB — RPR: RPR Ser Ql: NONREACTIVE

## 2020-03-25 LAB — LIPID PANEL
Cholesterol: 150 mg/dL (ref 0–200)
HDL: 39 mg/dL — ABNORMAL LOW (ref >40)
LDL Cholesterol: 76 mg/dL (ref 0–99)
Total CHOL/HDL Ratio: 3.8 RATIO
Triglycerides: 176 mg/dL — ABNORMAL HIGH (ref <150)
VLDL: 35 mg/dL (ref 0–40)

## 2020-03-25 LAB — HIV ANTIBODY (ROUTINE TESTING W REFLEX): HIV Screen 4th Generation wRfx: NONREACTIVE

## 2020-03-25 MED ORDER — DOXYCYCLINE HYCLATE 100 MG PO TABS
100.0000 mg | ORAL_TABLET | Freq: Two times a day (BID) | ORAL | Status: DC
  Administered 2020-03-25 – 2020-03-26 (×3): 100 mg via ORAL
  Filled 2020-03-25 (×4): qty 1
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
## 2020-03-25 NOTE — Progress Notes (Signed)
[facility]  03/25/2020 1:07 PM Dylan Gay  MRN:  [id number]   Subjective:   Patient is a 24 year old male who presented voluntarily to the hospital for treatment of depression, hallucinations, and suicidal thoughts. . He recently began using cocaine about a month ago, and starting 2-3 weeks ago began getting very paranoid. Patient had no acute events overnight, and was medication compliant. Patient seen at bedside today and he notes some daytime fatigue from starting Seroquel overnight. He does note that he was able to sleep very well, and he feels it has helped him. He denies suicidal ideation, homicidal ideation, visual hallucinations, and auditory hallucinations. When asked about tracking devices today he denies feeling that those are present. Reviewed admission labs with patient and positive chlamydia test. Informed about starting doxycycline 100 mg BID for 7 days to treat infection.   Principal Problem: Severe major depression, single episode (HCC) Diagnosis: Principal Problem:   Severe major depression, single episode (HCC) Active Problems:   Cocaine-induced psychotic disorder (HCC)   Cocaine abuse (HCC)  Total Time spent with patient: 30 minutes  Past Psychiatric History: No previous hospitalization. No history of suicide attempts. No history of violence. Seen in the emergency room previously and referred for outpatient treatment but has not followed up.  Past Medical History: History reviewed. No pertinent past medical history. History reviewed. No pertinent surgical history. Family History: History reviewed. No pertinent family history. Family Psychiatric  History: Reports his mother has depression. No family history of suicide attempts Social History:  Social History   Substance and Sexual Activity  Alcohol Use Yes   Comment: not daily      Social History   Substance and Sexual Activity  Drug Use Yes  . Types: Marijuana, Cocaine    Social  History   Socioeconomic History  . Marital status: Single    Spouse name: Not on file  . Number of children: Not on file  . Years of education: Not on file  . Highest education level: Not on file  Occupational History  . Not on file  Tobacco Use  . Smoking status: Former Games developermoker  . Smokeless tobacco: Never Used  Vaping Use  . Vaping Use: Never used  Substance and Sexual Activity  . Alcohol use: Yes    Comment: not daily   . Drug use: Yes    Types: Marijuana, Cocaine  . Sexual activity: Yes  Other Topics Concern  . Not on file  Social History Narrative  . Not on file   Social Determinants of Health   Financial Resource Strain: Not on file  Food Insecurity: Not on file  Transportation Needs: Not on file  Physical Activity: Not on file  Stress: Not on file  Social Connections: Not on file   Additional Social History:                         Sleep: Good  Appetite:  Fair  Current Medications: Current Facility-Administered Medications  Medication Dose Route Frequency Provider Last Rate Last Admin  . acetaminophen (TYLENOL) tablet 650 mg  650 mg Oral Q6H PRN Clapacs, John T, MD      . alum & mag hydroxide-simeth (MAALOX/MYLANTA) 200-200-20 MG/5ML suspension 30 mL  30 mL Oral Q4H PRN Clapacs, John T, MD      . doxycycline (VIBRA-TABS) tablet 100 mg  100 mg Oral Q12H Dylan SansFreeman, Maddilyn Campus M, MD   100 mg at 03/25/20 1249  .  hydrOXYzine (ATARAX/VISTARIL) tablet 50 mg  50 mg Oral TID PRN Clapacs, Jackquline Denmark, MD   50 mg at 03/24/20 2126  . magnesium hydroxide (MILK OF MAGNESIA) suspension 30 mL  30 mL Oral Daily PRN Clapacs, John T, MD      . QUEtiapine (SEROQUEL) tablet 50 mg  50 mg Oral QHS Dylan Sans, MD   50 mg at 03/24/20 2126    Lab Results:  Results for orders placed or performed during the hospital encounter of 03/24/20 (from the past 48 hour(s))  Urinalysis, Routine w reflex microscopic Urine, Clean Catch     Status: Abnormal   Collection Time: 03/24/20  6:13  PM  Result Value Ref Range   Color, Urine YELLOW (A) YELLOW   APPearance HAZY (A) CLEAR   Specific Gravity, Urine 1.030 1.005 - 1.030   pH 5.0 5.0 - 8.0   Glucose, UA NEGATIVE NEGATIVE mg/dL   Hgb urine dipstick NEGATIVE NEGATIVE   Bilirubin Urine NEGATIVE NEGATIVE   Ketones, ur NEGATIVE NEGATIVE mg/dL   Protein, ur NEGATIVE NEGATIVE mg/dL   Nitrite NEGATIVE NEGATIVE   Leukocytes,Ua NEGATIVE NEGATIVE    Comment: Performed at [facility], [address] [place], [place] [address]  Chlamydia/NGC rt PCR [facility] only)     Status: Abnormal   Collection Time: 03/24/20  6:13 PM   Specimen: Urine, Clean Catch  Result Value Ref Range   Specimen source GC/Chlam URINE, RANDOM    Chlamydia Tr DETECTED (A) NOT DETECTED   N gonorrhoeae NOT DETECTED NOT DETECTED    Comment: (NOTE) This CT/NG assay has not been evaluated in patients with a history of  hysterectomy. Performed at [facility], [address] [place], [place] [address]   Lipid panel     Status: Abnormal   Collection Time: 03/25/20  7:05 AM  Result Value Ref Range   Cholesterol 150 0 - 200 mg/dL   Triglycerides 354 (H) <150 mg/dL   HDL 39 (L) >65 mg/dL   Total CHOL/HDL Ratio 3.8 RATIO   VLDL 35 0 - 40 mg/dL   LDL Cholesterol 76 0 - 99 mg/dL    Comment:        Total Cholesterol/HDL:CHD Risk Coronary Heart Disease Risk Table                     Men   Women  1/2 Average Risk   3.4   3.3  Average Risk       5.0   4.4  2 X Average Risk   9.6   7.1  3 X Average Risk  23.4   11.0        Use the calculated Patient Ratio above and the CHD Risk Table to determine the patient's CHD Risk.        ATP III CLASSIFICATION (LDL):  <100     mg/dL   Optimal  681-275  mg/dL   Near or Above                    Optimal  130-159  mg/dL   Borderline  170-017  mg/dL   High  >494     mg/dL   Very High Performed at [facility], [address] [place], [place] [address]   Hemoglobin A1c     Status: None    Collection Time: 03/25/20  7:05 AM  Result Value Ref Range   Hgb A1c MFr Bld 5.5 4.8 - 5.6 %  Comment: (NOTE) Pre diabetes:          5.7%-6.4%  Diabetes:              >6.4%  Glycemic control for   <7.0% adults with diabetes    Mean Plasma Glucose 111.15 mg/dL    Comment: Performed at [facility], [address], [place], [place] [address]  Hepatitis panel, acute     Status: None   Collection Time: 03/25/20  7:05 AM  Result Value Ref Range   Hepatitis B Surface Ag NON REACTIVE NON REACTIVE   HCV Ab NON REACTIVE NON REACTIVE    Comment: (NOTE) Nonreactive HCV antibody screen is consistent with no HCV infections,  unless recent infection is suspected or other evidence exists to indicate HCV infection.     Hep A IgM NON REACTIVE NON REACTIVE   Hep B C IgM NON REACTIVE NON REACTIVE    Comment: Performed at [facility], [address] [place], [place] [address]  RPR     Status: None   Collection Time: 03/25/20  7:05 AM  Result Value Ref Range   RPR Ser Ql NON REACTIVE NON REACTIVE    Comment: Performed at [facility], [address] [place], [place] [address]  HIV Antibody (routine testing w rflx)     Status: None   Collection Time: 03/25/20  7:05 AM  Result Value Ref Range   HIV Screen 4th Generation wRfx Non Reactive Non Reactive    Comment: Performed at [facility], [address] [place], [place] [address]    Blood Alcohol level:  Lab Results  Component Value Date   [facility] <10 03/23/2020   ETH <10 03/13/2020    Metabolic Disorder Labs: Lab Results  Component Value Date   HGBA1C 5.5 03/25/2020   MPG 111.15 03/25/2020   No results found for: PROLACTIN Lab Results  Component Value Date   CHOL 150 03/25/2020   TRIG 176 (H) 03/25/2020   HDL 39 (L) 03/25/2020   CHOLHDL 3.8 03/25/2020   VLDL 35 03/25/2020   LDLCALC 76 03/25/2020    Physical Findings: AIMS: Facial and Oral Movements Muscles of Facial Expression: None, normal Lips  and Perioral Area: None, normal Jaw: None, normal Tongue: None, normal,Extremity Movements Upper (arms, wrists, hands, fingers): None, normal Lower (legs, knees, ankles, toes): None, normal, Trunk Movements Neck, shoulders, hips: None, normal, Overall Severity Severity of abnormal movements (highest score from questions above): None, normal Incapacitation due to abnormal movements: None, normal Patient's awareness of abnormal movements (rate only patient's report): No Awareness, Dental Status Current problems with teeth and/or dentures?: No Does patient usually wear dentures?: No  CIWA:  CIWA-Ar Total: 1 COWS:  COWS Total Score: 2  Musculoskeletal: Strength & Muscle Tone: within normal limits Gait & Station: normal Patient leans: N/A  Psychiatric Specialty Exam: Physical Exam  Review of Systems  Blood pressure 111/79, pulse 84, temperature 98.4 F (36.9 C), temperature source Oral, resp. rate 18, height 5\' 5"  (1.651 m), weight 83 kg, SpO2 97 %.Body mass index is 30.45 kg/m.  General Appearance: Fairly Groomed  Eye Contact:  Good  Speech:  Clear and Coherent  Volume:  Normal  Mood:  Anxious  Affect:  Congruent  Thought Process:  Coherent  Orientation:  Full (Time, Place, and Person)  Thought Content:  Logical  Suicidal Thoughts:  No  Homicidal Thoughts:  No  Memory:  Immediate;   Fair Recent;   Fair Remote;  Fair  Judgement:  Fair  Insight:  Shallow  Psychomotor Activity:  Decreased  Concentration:  Concentration: Fair and Attention Span: Fair  Recall:  [employer] of Knowledge:  Fair  Language:  Fair  Akathisia:  Negative  Handed:  Right  AIMS (if indicated):     Assets:  Communication Skills Desire for Improvement Housing Resilience  ADL's:  Intact  Cognition:  WNL  Sleep:  Number of Hours: 6.3     Treatment Plan Summary: Daily contact with patient to assess and evaluate symptoms and progress in treatment, Medication management and Plan64 year old man  presents the emergency room with major depressiondlow self-esteem,very poor sleep,very poor appetite,suicidal ideation, visual hallucinations, and auditory hallucination. He also endorses paranoia of tracking device being in his body, and also on his phone. Continue Seroquel 50 mg QHS for mood, hallucinations, and paranoia. Hydroxyzine PRN for anxiety. Start doxycycline 100 mg BID for 7 days for chlamydia.   Dylan Sans, MD 03/25/2020, 1:07 PM

## 2020-03-25 NOTE — BHH Suicide Risk Assessment (Signed)
[facility] INPATIENT:  Family/Significant Other Suicide Prevention Education  Suicide Prevention Education:  Patient Refusal for Family/Significant Other Suicide Prevention Education: The patient Dylan Gay has refused to provide written consent for family/significant other to be provided Family/Significant Other Suicide Prevention Education during admission and/or prior to discharge.  Physician notified.  SPE completed with pt, as pt refused to consent to family contact. SPI pamphlet provided to pt and pt was encouraged to share information with support network, ask questions, and talk about any concerns relating to SPE. Pt denies access to guns/firearms and verbalized understanding of information provided. Mobile Crisis information also provided to pt.   Harden Mo 03/25/2020, 11:37 AM

## 2020-03-25 NOTE — Progress Notes (Signed)
Recreation Therapy Notes  INPATIENT RECREATION THERAPY ASSESSMENT  Patient Details Name: Lauren Aguayo MRN: [id number] DOB: April 26, 1995 Today's Date: 03/25/2020       Information Obtained From: Patient  Able to Participate in Assessment/Interview: Yes  Patient Presentation: Responsive,Oriented  Reason for Admission (Per Patient): Active Symptoms,Substance Abuse  Patient Stressors: Other (Comment) (Drugs.)  Coping Skills:   TV,Music,Deep Breathing,Substance Abuse,Isolation,Talk  Leisure Interests (2+):  Community - Travel (Comment),Nature - Hiking [place])  Frequency of Recreation/Participation: Monthly  Awareness of Community Resources:  Yes  Community Resources:  Recreation Center,Restaurants  Current Use: Yes  If no, Barriers?:    Expressed Interest in [employer] Information: No  [employer] of Residence:  Film/video editor  Patient Main Form of Transportation: Set designer  Patient Strengths:  working,landscaping  Patient Identified Areas of Improvement:  N/A  Patient Goal for Hospitalization:  To get better medication for depression and anxiety  Current SI (including self-harm):  No  Current HI:  No  Current AVH: No  Staff Intervention Plan: Group Attendance,Collaborate with Interdisciplinary Treatment Team  Consent to Intern Participation: N/A  Grover Robinson 03/25/2020, 12:14 PM

## 2020-03-25 NOTE — Plan of Care (Signed)
Patient denies anxiety with this writer  Problem: Self-Concept: Goal: Level of anxiety will decrease Outcome: Progressing   

## 2020-03-25 NOTE — Plan of Care (Signed)
D- Patient alert and oriented. Patient presented in a pleasant mood on assessment stating that he slept great last night "I'm still sleepy". Patient denied SI, HI, AVH, and pain at this time. Patient also denied any signs/symptoms of depression/anxiety, stating "I feel great, actually". Patient had no stated goals for today.  A- Scheduled medications administered to patient, per MD orders. Support and encouragement provided.  Routine safety checks conducted every 15 minutes.  Patient informed to notify staff with problems or concerns.  R- No adverse drug reactions noted. Patient contracts for safety at this time. Patient compliant with medications and treatment plan. Patient receptive, calm, and cooperative. Patient interacts well with others on the unit.  Patient remains safe at this time.  Problem: Education: Goal: Ability to state activities that reduce stress will improve Outcome: Progressing   Problem: Coping: Goal: Ability to identify and develop effective coping behavior will improve Outcome: Progressing   Problem: Self-Concept: Goal: Ability to identify factors that promote anxiety will improve Outcome: Progressing Goal: Level of anxiety will decrease Outcome: Progressing Goal: Ability to modify response to factors that promote anxiety will improve Outcome: Progressing   Problem: Education: Goal: Utilization of techniques to improve thought processes will improve Outcome: Progressing Goal: Knowledge of the prescribed therapeutic regimen will improve Outcome: Progressing   Problem: Activity: Goal: Interest or engagement in leisure activities will improve Outcome: Progressing Goal: Imbalance in normal sleep/wake cycle will improve Outcome: Progressing   Problem: Coping: Goal: Coping ability will improve Outcome: Progressing Goal: Will verbalize feelings Outcome: Progressing   Problem: Health Behavior/Discharge Planning: Goal: Ability to make decisions will  improve Outcome: Progressing Goal: Compliance with therapeutic regimen will improve Outcome: Progressing   Problem: Role Relationship: Goal: Will demonstrate positive changes in social behaviors and relationships Outcome: Progressing   Problem: Safety: Goal: Ability to disclose and discuss suicidal ideas will improve Outcome: Progressing Goal: Ability to identify and utilize support systems that promote safety will improve Outcome: Progressing   Problem: Self-Concept: Goal: Will verbalize positive feelings about self Outcome: Progressing Goal: Level of anxiety will decrease Outcome: Progressing   Problem: Education: Goal: Knowledge of General Education information will improve Description: Including pain rating scale, medication(s)/side effects and non-pharmacologic comfort measures Outcome: Progressing   Problem: Health Behavior/Discharge Planning: Goal: Ability to manage health-related needs will improve Outcome: Progressing   Problem: Clinical Measurements: Goal: Ability to maintain clinical measurements within normal limits will improve Outcome: Progressing Goal: Will remain free from infection Outcome: Progressing Goal: Diagnostic test results will improve Outcome: Progressing Goal: Respiratory complications will improve Outcome: Progressing Goal: Cardiovascular complication will be avoided Outcome: Progressing   Problem: Activity: Goal: Risk for activity intolerance will decrease Outcome: Progressing   Problem: Nutrition: Goal: Adequate nutrition will be maintained Outcome: Progressing   Problem: Coping: Goal: Level of anxiety will decrease Outcome: Progressing   Problem: Elimination: Goal: Will not experience complications related to bowel motility Outcome: Progressing Goal: Will not experience complications related to urinary retention Outcome: Progressing   Problem: Pain Managment: Goal: General experience of comfort will improve Outcome:  Progressing   Problem: Safety: Goal: Ability to remain free from injury will improve Outcome: Progressing   Problem: Skin Integrity: Goal: Risk for impaired skin integrity will decrease Outcome: Progressing

## 2020-03-25 NOTE — Progress Notes (Signed)
Recreation Therapy Notes  INPATIENT RECREATION TR PLAN  Patient Details Name: Aslan Himes MRN: [id number] DOB: 09-26-1995 Today's Date: 03/25/2020  Rec Therapy Plan Is patient appropriate for Therapeutic Recreation?: Yes Treatment times per week: at least 3 Estimated Length of Stay: 5-7 days TR Treatment/Interventions: Group participation (Comment)  Discharge Criteria    Discharge Summary     Danelly Hassinger 03/25/2020, 12:16 PM

## 2020-03-25 NOTE — B[facility] Group Notes (Signed)
LCSW Group Therapy Note  03/25/2020 1:20 PM  Type of Therapy/Topic:  Group Therapy:  Balance in Life  Participation Level:  Did Not Attend  Description of Group:    This group will address the concept of balance and how it feels and looks when one is unbalanced. Patients will be encouraged to process areas in their lives that are out of balance and identify reasons for remaining unbalanced. Facilitators will guide patients in utilizing problem-solving interventions to address and correct the stressor making their life unbalanced. Understanding and applying boundaries will be explored and addressed for obtaining and maintaining a balanced life. Patients will be encouraged to explore ways to assertively make their unbalanced needs known to significant others in their lives, using other group members and facilitator for support and feedback.  Therapeutic Goals: 1. Patient will identify two or more emotions or situations they have that consume much of in their lives. 2. Patient will identify signs/triggers that life has become out of balance:  3. Patient will identify two ways to set boundaries in order to achieve balance in their lives:  4. Patient will demonstrate ability to communicate their needs through discussion and/or role plays  Summary of Patient Progress: X   Therapeutic Modalities:   Cognitive Behavioral Therapy Solution-Focused Therapy Assertiveness Training  Giannie Soliday R. Naftuli Dalsanto, MSW, LCSW, LCAS 03/25/2020 1:20 PM   

## 2020-03-25 NOTE — Progress Notes (Signed)
Recreation Therapy Notes  Date: 03/25/2020  Time: 9:30 am   Location: Craft room   Behavioral response: N/A   Intervention Topic: Animal Assisted therapy    Discussion/Intervention: Patient did not attend group.   Clinical Observations/Feedback:  Patient did not attend group.   Tresea Heine LRT/CTRS        Hiep Ollis 03/25/2020 11:32 AM

## 2020-03-25 NOTE — Progress Notes (Signed)
Patient calm and cooperative during assessment denying SI/HI/AVH, anxiety and depression with this Clinical research associate. Patient given education, support, and encouragement to be active in his treatment plan. Patient compliant with medication administration per MD orders. Patient isolative to his room this evening. Patient being monitored Q 15 minutes for safety per unit protocol. Pt remains safe on the unit.

## 2020-03-26 ENCOUNTER — Other Ambulatory Visit: Payer: Self-pay | Admitting: Behavioral Health

## 2020-03-26 LAB — URINE CULTURE: Culture: NO GROWTH

## 2020-03-26 MED ORDER — DOXYCYCLINE HYCLATE 100 MG PO TABS: 100.0000 mg | ORAL_TABLET | Freq: Two times a day (BID) | ORAL | 0 refills | Status: DC

## 2020-03-26 MED ORDER — QUETIAPINE FUMARATE 50 MG PO TABS: 50.0000 mg | ORAL_TABLET | Freq: Every day | ORAL | 0 refills | Status: DC

## 2020-03-26 MED ORDER — HYDROXYZINE HCL 50 MG PO TABS: 50.0000 mg | ORAL_TABLET | Freq: Three times a day (TID) | ORAL | 0 refills | Status: DC | PRN

## 2020-03-26 MED ORDER — QUETIAPINE FUMARATE 50 MG PO TABS: 50.0000 mg | ORAL_TABLET | Freq: Every day | ORAL | 1 refills | Status: DC

## 2020-03-26 MED ORDER — HYDROXYZINE HCL 50 MG PO TABS: 50.0000 mg | ORAL_TABLET | Freq: Three times a day (TID) | ORAL | 1 refills | Status: DC | PRN

## 2020-03-26 NOTE — Progress Notes (Signed)
Recreation Therapy Notes  INPATIENT RECREATION TR PLAN  Patient Details Name: Dylan Gay MRN: [id number] DOB: 04-Jul-1995 Today's Date: 03/26/2020  Rec Therapy Plan Is patient appropriate for Therapeutic Recreation?: Yes Treatment times per week: at least 3 Estimated Length of Stay: 5-7 days TR Treatment/Interventions: Group participation (Comment)  Discharge Criteria Pt will be discharged from therapy if:: Discharged Treatment plan/goals/alternatives discussed and agreed upon by:: Patient/family  Discharge Summary Short term goals set: Patient will engage in groups without prompting or encouragement from LRT x3 group sessions within 5 recreation therapy group sessions Short term goals met: Not met Reason goals not met: Patient did not attend any groups Therapeutic equipment acquired: N/A Reason patient discharged from therapy: Discharge from hospital Pt/family agrees with progress & goals achieved: Yes Date patient discharged from therapy: 03/26/20   Zachrey Deutscher 03/26/2020, 2:42 PM

## 2020-03-26 NOTE — [facility] Suicide Risk Assessment (Signed)
[facility] Discharge Suicide Risk Assessment   Principal Problem: Severe major depression, single episode (HCC) Discharge Diagnoses: Principal Problem:   Severe major depression, single episode (HCC) Active Problems:   Cocaine-induced psychotic disorder (HCC)   Cocaine abuse (HCC)   Total Time spent with patient: 30 minutes  Musculoskeletal: Strength & Muscle Tone: within normal limits Gait & Station: normal Patient leans: N/A  Psychiatric Specialty Exam: Review of Systems  Constitutional: Negative for activity change and fatigue.  HENT: Negative for rhinorrhea and sore throat.   Eyes: Negative for photophobia and visual disturbance.  Respiratory: Negative for cough and shortness of breath.   Cardiovascular: Negative for chest pain and palpitations.  Gastrointestinal: Negative for constipation, diarrhea, nausea and vomiting.  Endocrine: Negative for cold intolerance and heat intolerance.  Genitourinary: Positive for penile discharge. Negative for difficulty urinating.  Musculoskeletal: Negative for arthralgias and myalgias.  Skin: Negative for rash and wound.  Allergic/Immunologic: Negative for environmental allergies and food allergies.  Neurological: Negative for dizziness and headaches.  Hematological: Negative for adenopathy. Does not bruise/bleed easily.  Psychiatric/Behavioral: Negative for behavioral problems, confusion, hallucinations, sleep disturbance and suicidal ideas.    Blood pressure 125/73, pulse 80, temperature 98 F (36.7 C), temperature source Oral, resp. rate 18, height 5\' 5"  (1.651 m), weight 83 kg, SpO2 99 %.Body mass index is 30.45 kg/m.  General Appearance: Well Groomed  ::  Good  Speech:  Clear and Coherent and Normal Rate  Volume:  Normal  Mood:  Euthymic  Affect:  Congruent  Thought Process:  Coherent and Linear  Orientation:  Full (Time, Place, and Person)  Thought Content:  Logical  Suicidal Thoughts:  No  Homicidal Thoughts:  No   Memory:  Immediate;   Fair Recent;   Fair Remote;   Fair  Judgement:  Intact  Insight:  Shallow  Psychomotor Activity:  Normal  Concentration:  Fair  Recall:  [id number] of Knowledge:Fair  Language: Fair  Akathisia:  Negative  Handed:  Right  AIMS (if indicated):     Assets:  Communication Skills Desire for Improvement Housing Resilience Social Support Talents/Skills Vocational/Educational  Sleep:  Number of Hours: 6.25  Cognition: WNL  ADL's:  Intact   Mental Status Per Nursing Assessment::   On Admission:  NA  Demographic Factors:  Male  Loss Factors: NA  Historical Factors: Impulsivity  Risk Reduction Factors:   Sense of responsibility to family, Employed, Living with another person, especially a relative, Positive social support, Positive therapeutic relationship and Positive coping skills or problem solving skills  Continued Clinical Symptoms:  Alcohol/Substance Abuse/Dependencies Previous Psychiatric Diagnoses and Treatments  Cognitive Features That Contribute To Risk:  None    Suicide Risk:  Minimal: No identifiable suicidal ideation.  Patients presenting with no risk factors but with morbid ruminations; may be classified as minimal risk based on the severity of the depressive symptoms   Follow-up Information    [facility] Follow up.   Specialty: [facility] information: [address] [place] [phone]               Plan Of Care/Follow-up recommendations:  Activity:  as tolerated Diet:  regular diet  [phone], MD 03/26/2020, 10:00 AM

## 2020-03-26 NOTE — Progress Notes (Signed)
  [facility] Adult Case Management Discharge Plan :  Will you be returning to the same living situation after discharge:  Yes,  pt plans to discharge home. At discharge, do you have transportation home?: Yes,  mother to provide transportation home. Do you have the ability to pay for your medications: No.  Release of information consent forms completed and in the chart;  Patient's signature needed at discharge.  Patient to Follow up at:  Follow-up Information    [facility] Follow up.   Specialty: [facility] information: [address] [place] [address] [phone]               Next level of care provider has access to [facility] Link:no  Safety Planning and Suicide Prevention discussed: Yes,  SPE completed with the pt as pt declined collateral/family contact.  Have you used any form of tobacco in the last 30 days? (Cigarettes, Smokeless Tobacco, Cigars, and/or Pipes): No  Has patient been referred to the Quitline?: Patient refused referral  Patient has been referred for addiction treatment: Pt. refused referral  Glenis Smoker, LCSW 03/26/2020, 9:59 AM

## 2020-03-26 NOTE — Plan of Care (Signed)
  Problem: Group Participation Goal: STG - Patient will engage in groups without prompting or encouragement from LRT x3 group sessions within 5 recreation therapy group sessions Description: STG - Patient will engage in groups without prompting or encouragement from LRT x3 group sessions within 5 recreation therapy group sessions 03/26/2020 1441 by Ernest Haber, LRT Outcome: Not Applicable [id number] by Ernest Haber, LRT Outcome: Not Met (add Reason) Note: Patient did not attend any groups.

## 2020-03-26 NOTE — Tx Team (Signed)
Interdisciplinary Treatment and Diagnostic Plan Update  03/26/2020 Time of Session: 9:00AM Dylan Gay MRN: [id number]  Principal Diagnosis: Severe major depression, single episode (HCC)  Secondary Diagnoses: Principal Problem:   Severe major depression, single episode (HCC) Active Problems:   Cocaine-induced psychotic disorder (HCC)   Cocaine abuse (HCC)   Current Medications:  Current Facility-Administered Medications  Medication Dose Route Frequency Provider Last Rate Last Admin  . acetaminophen (TYLENOL) tablet 650 mg  650 mg Oral Q6H PRN Clapacs, John T, MD      . alum & mag hydroxide-simeth (MAALOX/MYLANTA) 200-200-20 MG/5ML suspension 30 mL  30 mL Oral Q4H PRN Clapacs, John T, MD      . doxycycline (VIBRA-TABS) tablet 100 mg  100 mg Oral Q12H Jesse Sans, MD   100 mg at 03/26/20 0759  . hydrOXYzine (ATARAX/VISTARIL) tablet 50 mg  50 mg Oral TID PRN Clapacs, Jackquline Denmark, MD   50 mg at 03/25/20 2105  . magnesium hydroxide (MILK OF MAGNESIA) suspension 30 mL  30 mL Oral Daily PRN Clapacs, John T, MD      . QUEtiapine (SEROQUEL) tablet 50 mg  50 mg Oral QHS Jesse Sans, MD   50 mg at 03/25/20 2105   PTA Medications: Medications Prior to Admission  Medication Sig Dispense Refill Last Dose  . ibuprofen (ADVIL,MOTRIN) 400 MG tablet Take 1 tablet (400 mg total) by mouth every 6 (six) hours as needed for moderate pain. (Patient not taking: Reported on 03/23/2020) 20 tablet 0   . levETIRAcetam (KEPPRA) 500 MG tablet Take 1 tablet (500 mg total) by mouth 2 (two) times daily. (Patient not taking: Reported on 03/23/2020) 60 tablet 1     Patient Stressors: Marital or family conflict Substance abuse  Patient Strengths: Motivation for treatment/growth Supportive family/friends  Treatment Modalities: Medication Management, Group therapy, Case management,  1 to 1 session with clinician, Psychoeducation, Recreational therapy.   Physician Treatment Plan for Primary  Diagnosis: Severe major depression, single episode (HCC) Long Term Goal(s): Improvement in symptoms so as ready for discharge Improvement in symptoms so as ready for discharge   Short Term Goals: Ability to identify changes in lifestyle to reduce recurrence of condition will improve Ability to verbalize feelings will improve Ability to disclose and discuss suicidal ideas Ability to demonstrate self-control will improve Ability to identify and develop effective coping behaviors will improve Compliance with prescribed medications will improve Ability to identify triggers associated with substance abuse/mental health issues will improve Ability to identify changes in lifestyle to reduce recurrence of condition will improve Ability to verbalize feelings will improve Ability to disclose and discuss suicidal ideas Ability to demonstrate self-control will improve Ability to identify and develop effective coping behaviors will improve Compliance with prescribed medications will improve Ability to identify triggers associated with substance abuse/mental health issues will improve  Medication Management: Evaluate patient's response, side effects, and tolerance of medication regimen.  Therapeutic Interventions: 1 to 1 sessions, Unit Group sessions and Medication administration.  Evaluation of Outcomes: Adequate for Discharge  Physician Treatment Plan for Secondary Diagnosis: Principal Problem:   Severe major depression, single episode (HCC) Active Problems:   Cocaine-induced psychotic disorder (HCC)   Cocaine abuse (HCC)  Long Term Goal(s): Improvement in symptoms so as ready for discharge Improvement in symptoms so as ready for discharge   Short Term Goals: Ability to identify changes in lifestyle to reduce recurrence of condition will improve Ability to verbalize feelings will improve Ability to disclose and discuss suicidal  ideas Ability to demonstrate self-control will improve Ability  to identify and develop effective coping behaviors will improve Compliance with prescribed medications will improve Ability to identify triggers associated with substance abuse/mental health issues will improve Ability to identify changes in lifestyle to reduce recurrence of condition will improve Ability to verbalize feelings will improve Ability to disclose and discuss suicidal ideas Ability to demonstrate self-control will improve Ability to identify and develop effective coping behaviors will improve Compliance with prescribed medications will improve Ability to identify triggers associated with substance abuse/mental health issues will improve     Medication Management: Evaluate patient's response, side effects, and tolerance of medication regimen.  Therapeutic Interventions: 1 to 1 sessions, Unit Group sessions and Medication administration.  Evaluation of Outcomes: Adequate for Discharge   RN Treatment Plan for Primary Diagnosis: Severe major depression, single episode (HCC) Long Term Goal(s): Knowledge of disease and therapeutic regimen to maintain health will improve  Short Term Goals: Ability to demonstrate self-control, Ability to participate in decision making will improve, Ability to disclose and discuss suicidal ideas, Ability to identify and develop effective coping behaviors will improve and Compliance with prescribed medications will improve  Medication Management: RN will administer medications as ordered by provider, will assess and evaluate patient's response and provide education to patient for prescribed medication. RN will report any adverse and/or side effects to prescribing provider.  Therapeutic Interventions: 1 on 1 counseling sessions, Psychoeducation, Medication administration, Evaluate responses to treatment, Monitor vital signs and CBGs as ordered, Perform/monitor CIWA, COWS, AIMS and Fall Risk screenings as ordered, Perform wound care treatments as  ordered.  Evaluation of Outcomes: Adequate for Discharge   LCSW Treatment Plan for Primary Diagnosis: Severe major depression, single episode (HCC) Long Term Goal(s): Safe transition to appropriate next level of care at discharge, Engage patient in therapeutic group addressing interpersonal concerns.  Short Term Goals: Engage patient in aftercare planning with referrals and resources, Increase social support, Facilitate patient progression through stages of change regarding substance use diagnoses and concerns, Identify triggers associated with mental health/substance abuse issues and Increase skills for wellness and recovery  Therapeutic Interventions: Assess for all discharge needs, 1 to 1 time with Social worker, Explore available resources and support systems, Assess for adequacy in community support network, Educate family and significant other(s) on suicide prevention, Complete Psychosocial Assessment, Interpersonal group therapy.  Evaluation of Outcomes: Adequate for Discharge   Progress in Treatment: Attending groups: No. Participating in groups: No. Taking medication as prescribed: Yes. Toleration medication: Yes. Family/Significant other contact made: No, will contact:  pt declined family/collateral contacts. Patient understands diagnosis: Yes. Discussing patient identified problems/goals with staff: Yes. Medical problems stabilized or resolved: Yes. Denies suicidal/homicidal ideation: Yes. Issues/concerns per patient self-inventory: No. Other: None.  New problem(s) identified: No, Describe:  None.  New Short Term/Long Term Goal(s): detox, elimination of symptoms of psychosis, medication management for mood stabilization; elimination of SI thoughts; development of comprehensive mental wellness/sobriety plan.  Patient Goals: "I don't know. Outside of me taking my medicine, I know I feel a lot better than when I came in.   Discharge Plan or Barriers: Patient plans to  discharge home with outpatient treatment. Patient will need to contact [facility] [employer]. Regarding aftercare appointment.   Reason for Continuation of Hospitalization: Delusions  Depression Medication stabilization Withdrawal symptoms  Estimated Length of Stay: 1-7 days  Attendees: Patient: Dylan Gay 03/26/2020 10:02 AM  Physician: Les Pou, MD 03/26/2020 10:02 AM  Nursing: Jorene Minors, RN 03/26/2020  10:02 AM  RN Care Manager: 03/26/2020 10:02 AM  Social Worker: Vilma Meckel. Algis Greenhouse, MSW, [place], LCAS 03/26/2020 10:02 AM  Recreational Therapist: Hilbert Bible, LRT 03/26/2020 10:02 AM  Other: Penni Homans, MSW, LCSW 03/26/2020 10:02 AM  Other:  03/26/2020 10:02 AM  Other: 03/26/2020 10:02 AM    Scribe for Treatment Team: Glenis Smoker, LCSW 03/26/2020 10:02 AM

## 2020-03-26 NOTE — Discharge Summary (Signed)
Physician Discharge Summary Note  Patient:  Dylan Gay is an 24 y.o., male MRN:  [id number] DOB:  November 12, 1995 Patient phone:  [phone] (home)  Patient address:   [address] [place] [address],  Total Time spent with patient: 30 minutes  Date of Admission:  03/24/2020 Date of Discharge: 03/26/2020  Reason for Admission:  Patient is a 24 year old male who presented voluntarily to the hospital for treatment of depression, hallucinations, and suicidal thoughts  Principal Problem: Severe major depression, single episode [facility]) Discharge Diagnoses: Principal Problem:   Severe major depression, single episode (HCC) Active Problems:   Cocaine-induced psychotic disorder (HCC)   Cocaine abuse (HCC)   Past Psychiatric History: No previous hospitalization. No history of suicide attempts. No history of violence. Seen in the emergency room previously and referred for outpatient treatment but has not followed up.  Past Medical History: History reviewed. No pertinent past medical history. History reviewed. No pertinent surgical history. Family History: History reviewed. No pertinent family history. Family Psychiatric  History: Reports his mother has depression. No family history of suicide attempts Social History:  Social History   Substance and Sexual Activity  Alcohol Use Yes   Comment: not daily      Social History   Substance and Sexual Activity  Drug Use Yes  . Types: Marijuana, Cocaine    Social History   Socioeconomic History  . Marital status: Single    Spouse name: Not on file  . Number of children: Not on file  . Years of education: Not on file  . Highest education level: Not on file  Occupational History  . Not on file  Tobacco Use  . Smoking status: Former Games developer  . Smokeless tobacco: Never Used  Vaping Use  . Vaping Use: Never used  Substance and Sexual Activity  . Alcohol use: Yes    Comment: not daily   . Drug use:  Yes    Types: Marijuana, Cocaine  . Sexual activity: Yes  Other Topics Concern  . Not on file  Social History Narrative  . Not on file   Social Determinants of Health   Financial Resource Strain: Not on file  Food Insecurity: Not on file  Transportation Needs: Not on file  Physical Activity: Not on file  Stress: Not on file  Social Connections: Not on file    Hospital Course:  Patient is a 24 year old male who presented voluntarily to the hospital for treatment of depression, hallucinations, and suicidal thoughts. He recently began using cocaine about a month ago, and starting 2-3 weeks ago began getting very paranoid. He thought he heard people breaking into his home, was seeing shadows on the walls, and felt someone was out to get him. He felt there was a tracker in his body, and requested a full-body scan to assess for them. He also noted there was a new green light on his phone that has never been there before, and felt it proved someone was tracking him to harm him. Patient was started on Seroquel 50 mg nightly, and symptoms quickly resolved. Psychosis likely cocaine induced. At time of discharge he denies suicidal ideations, homicidal ideations, visual hallucinations, and auditory hallucinations. He denied any further thoughts of having a tracker inside him, or that people were out to get him. He states those were "just the drugs talking." While in the hospital, he was found to be positive for chlamydia and provided with doxycyline for treatment. At time  of discharge, treatment team felt patient was safe to discharge with close outpatient follow-up.   Physical Findings: AIMS: Facial and Oral Movements Muscles of Facial Expression: None, normal Lips and Perioral Area: None, normal Jaw: None, normal Tongue: None, normal,Extremity Movements Upper (arms, wrists, hands, fingers): None, normal Lower (legs, knees, ankles, toes): None, normal, Trunk Movements Neck, shoulders, hips: None,  normal, Overall Severity Severity of abnormal movements (highest score from questions above): None, normal Incapacitation due to abnormal movements: None, normal Patient's awareness of abnormal movements (rate only patient's report): No Awareness, Dental Status Current problems with teeth and/or dentures?: No Does patient usually wear dentures?: No  CIWA:  CIWA-Ar Total: 1 COWS:  COWS Total Score: 2  Musculoskeletal: Strength & Muscle Tone: within normal limits Gait & Station: normal Patient leans: N/A  Psychiatric Specialty Exam: Physical Exam Vitals and nursing note reviewed.  Constitutional:      Appearance: Normal appearance.  HENT:     Head: Normocephalic and atraumatic.     Right Ear: External ear normal.     Left Ear: External ear normal.     Nose: Nose normal.     Mouth/Throat:     Mouth: Mucous membranes are moist.     Pharynx: Oropharynx is clear.  Eyes:     Extraocular Movements: Extraocular movements intact.     Conjunctiva/sclera: Conjunctivae normal.     Pupils: Pupils are equal, round, and reactive to light.  Cardiovascular:     Rate and Rhythm: Normal rate.     Pulses: Normal pulses.  Pulmonary:     Effort: Pulmonary effort is normal.     Breath sounds: Normal breath sounds.  Abdominal:     General: Abdomen is flat.     Palpations: Abdomen is soft.  Musculoskeletal:        General: No swelling. Normal range of motion.     Cervical back: Normal range of motion and neck supple.  Skin:    General: Skin is warm and dry.  Neurological:     General: No focal deficit present.     Mental Status: He is alert and oriented to person, place, and time.  Psychiatric:        Mood and Affect: Mood normal.        Behavior: Behavior normal.        Thought Content: Thought content normal.        Judgment: Judgment normal.     Review of Systems  Constitutional: Negative for activity change and fatigue.  HENT: Negative for rhinorrhea and sore throat.   Eyes:  Negative for photophobia and visual disturbance.  Respiratory: Negative for cough and shortness of breath.   Cardiovascular: Negative for chest pain and palpitations.  Gastrointestinal: Negative for constipation, diarrhea, nausea and vomiting.  Endocrine: Negative for cold intolerance and heat intolerance.  Genitourinary: Positive for penile discharge. Negative for difficulty urinating.  Musculoskeletal: Negative for arthralgias and myalgias.  Skin: Negative for rash and wound.  Allergic/Immunologic: Negative for environmental allergies and food allergies.  Neurological: Negative for dizziness and headaches.  Hematological: Negative for adenopathy. Does not bruise/bleed easily.  Psychiatric/Behavioral: Negative for behavioral problems, confusion, hallucinations, sleep disturbance and suicidal ideas.    Blood pressure 125/73, pulse 80, temperature 98 F (36.7 C), temperature source Oral, resp. rate 18, height 5\' 5"  (1.651 m), weight 83 kg, SpO2 99 %.Body mass index is 30.45 kg/m.  General Appearance: Well Groomed  Eye Contact::  Good  Speech:  Clear and Coherent and Normal  Rate  Volume:  Normal  Mood:  Euthymic  Affect:  Congruent  Thought Process:  Coherent and Linear  Orientation:  Full (Time, Place, and Person)  Thought Content:  Logical  Suicidal Thoughts:  No  Homicidal Thoughts:  No  Memory:  Immediate;   Fair Recent;   Fair Remote;   Fair  Judgement:  Intact  Insight:  Shallow  Psychomotor Activity:  Normal  Concentration:  Fair  Recall:  Fair  Fund of Knowledge:Fair  Language: Fair  Akathisia:  Negative  Handed:  Right  AIMS (if indicated):     Assets:  Communication Skills Desire for Improvement Housing Resilience Social Support Talents/Skills Vocational/Educational  Sleep:  Number of Hours: 6.25  Cognition: WNL  ADL's:  Intact        Have you used any form of tobacco in the last 30 days? (Cigarettes, Smokeless Tobacco, Cigars, and/or Pipes): No  Has  this patient used any form of tobacco in the last 30 days? (Cigarettes, Smokeless Tobacco, Cigars, and/or Pipes) No  Blood Alcohol level:  Lab Results  Component Value Date   ETH <10 03/23/2020   ETH <10 03/13/2020    Metabolic Disorder Labs:  Lab Results  Component Value Date   HGBA1C 5.5 03/25/2020   MPG 111.15 03/25/2020   No results found for: PROLACTIN Lab Results  Component Value Date   CHOL 150 03/25/2020   TRIG 176 (H) 03/25/2020   HDL 39 (L) 03/25/2020   CHOLHDL 3.8 03/25/2020   VLDL 35 03/25/2020   LDLCALC 76 03/25/2020    See Psychiatric Specialty Exam and Suicide Risk Assessment completed by Attending Physician prior to discharge.  Discharge destination:  Home  Is patient on multiple antipsychotic therapies at discharge:  No   Has Patient had three or more failed trials of antipsychotic monotherapy by history:  No  Recommended Plan for Multiple Antipsychotic Therapies: NA  Discharge Instructions    Diet general   Complete by: As directed    Increase activity slowly   Complete by: As directed      Allergies as of 03/26/2020   No Known Allergies     Medication List    STOP taking these medications   levETIRAcetam 500 MG tablet Commonly known as: Keppra     TAKE these medications     Indication  doxycycline 100 MG tablet Commonly known as: VIBRA-TABS Take 1 tablet (100 mg total) by mouth every 12 (twelve) hours.  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  Indication: Feeling Anxious   ibuprofen 400 MG tablet Commonly known as: ADVIL Take 1 tablet (400 mg total) by mouth every 6 (six) hours as needed for moderate pain.  Indication: Pain   QUEtiapine 50 MG tablet Commonly known as: SEROQUEL Take 1 tablet (50 mg total) by mouth at bedtime.  Indication: Major Depressive Disorder        Follow-up Information    [facility] Follow up.   Specialty: [facility] information: [address] [place] [address] [phone]               Follow-up recommendations:  Activity:  as tolerated Diet:  regular diet  Comments:  7-day supply of free medications provided to patient along with printed 30-day scripts with 1 refill.   Signed: Jesse Sans, MD 03/26/2020, 10:08  AM

## 2020-03-26 NOTE — Progress Notes (Signed)
D: Pt alert and oriented. Pt denies experiencing any pain, SI/HI, or AVH at this time. Pt reports he will be able to keep himself safe when he returns home.  A: Pt received discharge and medication education/information. Pt belongings were returned and signed for at this time to include valuables from safe, printed prescriptions, and 7 day medication supply.   R: Pt verbalized understanding of discharge and medication education/information.  Pt escorted by staff to medical mall front lobby where pt was picked up by his younger brother and mother.

## 2020-03-26 NOTE — Progress Notes (Signed)
D:  Pt is A&O X4. Patient reported good sleep and good appetite. Pt rated normal energy level 7/10. Pt rated depression 0/10, anxiety 0/10. Denied SI/HI/AVH.  Pt denies physical problems. Patient rated pain 0/10.   A:  Medications administered per MD orders.  Emotional support and encouragement given patient.  R:  Denied SI and HI, contracts for safety.  Denied A/V hallucinations.  Safety maintained with 15 minute checks. Pt stated goal for today is to "Take my medicine".

## 2020-06-29 ENCOUNTER — Encounter (HOSPITAL_COMMUNITY): Payer: Self-pay | Admitting: *Deleted

## 2020-06-29 ENCOUNTER — Ambulatory Visit (HOSPITAL_COMMUNITY)
Admission: EM | Admit: 2020-06-29 | Discharge: 2020-06-29 | Disposition: A | Discharge status: Home / Self Care | Payer: Self-pay | Attending: Student | Admitting: Student

## 2020-06-29 ENCOUNTER — Other Ambulatory Visit: Payer: Self-pay | Admitting: Student

## 2020-06-29 ENCOUNTER — Other Ambulatory Visit: Payer: Self-pay

## 2020-06-29 DIAGNOSIS — S0500XA Injury of conjunctiva and corneal abrasion without foreign body, unspecified eye, initial encounter: Secondary | ICD-10-CM

## 2020-06-29 MED ORDER — ERYTHROMYCIN 5 MG/GM OP OINT: TOPICAL_OINTMENT | OPHTHALMIC | 0 refills | Status: AC

## 2020-06-29 MED ORDER — EYE WASH OPHTH SOLN
OPHTHALMIC | Status: AC
  Filled 2020-06-29: qty 118

## 2020-06-29 NOTE — ED Provider Notes (Signed)
[facility]    CSN: [id number] Arrival date & time: 06/29/20  1440      History   Chief Complaint Chief Complaint  Patient presents with  . Eye Problem    HPI Dylan Gay is a 25 y.o. male presenting with bilateral eye irritation x3 days. History cocaine abuse, cocaine-induced psychotic disorder, major depression.  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  Notes few days of eye irritation, photophobia, burning, redness, discharge in the morning, foreign body sensation right eye.  States he has blurred vision bilaterally at baseline, states this is unchanged.  Does not wear contacts or glasses but states that he would if he could afford them. Denies URI symptoms.  Denies  eye pain with movement, injury to eye, vision changes, double vision.     HPI  History reviewed. No pertinent past medical history.  Patient Active Problem List   Diagnosis Date Noted  . Cocaine abuse (HCC) 03/23/2020  . Severe major depression, single episode (HCC) 03/23/2020  . Cocaine-induced psychotic disorder (HCC) 03/13/2020    History reviewed. No pertinent surgical history.     Home Medications    Prior to Admission medications   Medication Sig Start Date End Date Taking? Authorizing Provider  erythromycin ophthalmic ointment Place a 1/2 inch ribbon of ointment into the lower eyelids for 7 days. 06/29/20 07/06/20 Yes Rhys Martini, PA-C  doxycycline (VIBRA-TABS) 100 MG tablet Take 1 tablet (100 mg total) by mouth every 12 (twelve) hours. 03/26/20   Jesse Sans, MD  hydrOXYzine (ATARAX/VISTARIL) 50 MG tablet Take 1 tablet (50 mg total) by mouth 3 (three) times daily as needed for anxiety. 03/26/20   Jesse Sans, MD  ibuprofen (ADVIL,MOTRIN) 400 MG tablet Take 1 tablet (400 mg total) by mouth every 6 (six) hours as needed for moderate pain. Patient not taking: Reported on 03/23/2020 08/26/15   Gayla Doss, MD  QUEtiapine (SEROQUEL) 50 MG tablet Take 1 tablet (50 mg total) by mouth at bedtime. 03/26/20   Jesse Sans, MD    Family History History reviewed. No pertinent family history.  Social History Social History   Tobacco Use  . Smoking status: Former Games developer  . Smokeless tobacco: Never Used  Vaping Use  . Vaping Use: Never used  Substance Use Topics  . Alcohol use: Yes    Comment: not daily   . Drug use: Yes    Types: Marijuana, Cocaine     Allergies   Patient has no known allergies.   Review of Systems Review of Systems  Constitutional: Negative for appetite change, chills and fever.  HENT: Negative for congestion, ear pain, rhinorrhea, sinus pressure, sinus pain and sore throat.   Eyes: Positive for photophobia, discharge, redness and itching. Negative for pain and visual disturbance.  Respiratory: Negative for cough, chest tightness, shortness of breath and wheezing.   Cardiovascular: Negative for chest pain and palpitations.  Gastrointestinal: Negative for abdominal pain, constipation, diarrhea, nausea and vomiting.  Genitourinary: Negative for dysuria, frequency and urgency.  Musculoskeletal: Negative for myalgias.  Neurological: Negative for dizziness, weakness and headaches.  Psychiatric/Behavioral: Negative for confusion.  All other systems reviewed and are negative.    Physical Exam Triage Vital Signs ED Triage Vitals  Enc Vitals Group     BP      Pulse      Resp      Temp      Temp  src      SpO2      Weight      Height      Head Circumference      Peak Flow      Pain Score      Pain Loc      Pain Edu?      Excl. in GC?    No data found.  Updated Vital Signs BP 113/74 (BP Location: Left Arm)   Pulse 97   Temp 98.3 F (36.8 C)   Resp 18   SpO2 96%   Visual Acuity Right Eye Distance: 20/70 Left Eye Distance: 20/70 Bilateral Distance: 20/70  Right Eye Near:   Left Eye Near:    Bilateral Near:     Physical Exam Vitals  reviewed.  Constitutional:      Appearance: Normal appearance.  HENT:     Head: Normocephalic and atraumatic.  Eyes:     General: Lids are normal. Lids are everted, no foreign bodies appreciated. Vision grossly intact. Gaze aligned appropriately. No visual field deficit.       Right eye: No foreign body, discharge or hordeolum.        Left eye: No foreign body, discharge or hordeolum.     Extraocular Movements: Extraocular movements intact.     Conjunctiva/sclera:     Right eye: Right conjunctiva is injected. No chemosis, exudate or hemorrhage.    Left eye: Left conjunctiva is injected. No chemosis, exudate or hemorrhage.    Pupils: Pupils are equal, round, and reactive to light.     Right eye: Corneal abrasion and fluorescein uptake present. Seidel exam negative.     Left eye: Corneal abrasion and fluorescein uptake present. Seidel exam negative.    Visual Fields: Right eye visual fields normal and left eye visual fields normal.      Comments: Bilateral conjunctiva diffusely injected. Solitary corneal abrasion on each cornea.  Neurological:     General: No focal deficit present.     Mental Status: He is alert and oriented to person, place, and time.  Psychiatric:        Mood and Affect: Mood normal.        Behavior: Behavior normal.        Thought Content: Thought content normal.        Judgment: Judgment normal.      UC Treatments / Results  Labs (all labs ordered are listed, but only abnormal results are displayed) Labs Reviewed - No data to display  EKG   Radiology No results found.  Procedures Procedures (including critical care time)  Medications Ordered in UC Medications - No data to display  Initial Impression / Assessment and Plan / UC Course  I have reviewed the triage vital signs and the nursing notes.  Pertinent labs & imaging results that were available during my care of the patient were reviewed by me and considered in my medical decision making (see  chart for details).      This patient is a 25 year old male presenting with bilateral corneal abrasion.  Vision grossly intact bilaterally. He doesn't wear contacts or glasses.  Will treat with erythromycin ointment and warm compresses as below. rec f/u with ophthalmology in 1-3 days. Discussed that failure to f/u with optho could result in permanent vision loss.  Return precautions discussed. F/u with optho if symptoms worsen/persist, including new vision changes, eye pain with movement; information provided.  This chart was dictated using voice recognition software, Dragon. Despite the best  efforts of this provider to proofread and correct errors, errors may still occur which can change documentation meaning.  Final Clinical Impressions(s) / UC Diagnoses   Final diagnoses:  Corneal abrasion, unspecified laterality, initial encounter     Discharge Instructions     -Apply the antibiotic ointment nightly for 7 days in a row.  Try to put half an inch of the ointment into the lower eyelid.  This can be messy, but that is okay; dab the excess around the eyelid. -You can use warm compresses 1-2 times daily to help relieve irritation. -Follow-up with eye doctor in the next 1 to 3 days.  Sooner if you develop vision changes, eye pain with movement, eyelid swelling, etc.  Information below. 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    ED Prescriptions    Medication Sig Dispense Auth. Provider   erythromycin ophthalmic ointment Place a 1/2 inch ribbon of ointment into the lower eyelids for 7 days. 3.5 g Rhys Martini, PA-C     PDMP not reviewed this encounter.   Rhys Martini, PA-C 06/29/20 [phone]

## 2020-06-29 NOTE — Discharge Instructions (Addendum)
-  Apply the antibiotic ointment nightly for 7 days in a row.  Try to put half an inch of the ointment into the lower eyelid.  This can be messy, but that is okay; dab the excess around the eyelid. -You can use warm compresses 1-2 times daily to help relieve irritation. -Follow-up with eye doctor in the next 1 to 3 days.  Sooner if you develop vision changes, eye pain with movement, eyelid swelling, etc.  Information below. 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

## 2020-06-29 NOTE — ED Triage Notes (Signed)
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 Pt reports some eye drainage and pain in bil eyes.

## 2021-01-15 ENCOUNTER — Encounter (HOSPITAL_COMMUNITY): Payer: Self-pay | Admitting: Emergency Medicine

## 2021-01-15 ENCOUNTER — Ambulatory Visit (HOSPITAL_COMMUNITY)
Admission: EM | Admit: 2021-01-15 | Discharge: 2021-01-15 | Disposition: A | Discharge status: Home / Self Care | Payer: Self-pay | Attending: Physician Assistant | Admitting: Physician Assistant

## 2021-01-15 ENCOUNTER — Other Ambulatory Visit: Payer: Self-pay

## 2021-01-15 DIAGNOSIS — R21 Rash and other nonspecific skin eruption: Secondary | ICD-10-CM

## 2021-01-15 DIAGNOSIS — Z113 Encounter for screening for infections with a predominantly sexual mode of transmission: Secondary | ICD-10-CM | POA: Insufficient documentation

## 2021-01-15 LAB — HEPATITIS PANEL, ACUTE
HCV Ab: NONREACTIVE
Hep A IgM: NONREACTIVE
Hep B C IgM: NONREACTIVE
Hepatitis B Surface Ag: NONREACTIVE

## 2021-01-15 LAB — HIV ANTIBODY (ROUTINE TESTING W REFLEX): HIV Screen 4th Generation wRfx: NONREACTIVE

## 2021-01-15 NOTE — ED Triage Notes (Signed)
Pt has had penile itching and rash in the groin area x 2 weeks. He would like STD testing.

## 2021-01-15 NOTE — Discharge Instructions (Addendum)
Recommend using mychart for results. We will call with any positive results. Try anti-fungal cream to cover rash in case this is caused by "jock itch" or tinea cruris. Follow up with any further concerns.

## 2021-01-15 NOTE — ED Provider Notes (Signed)
[facility]    CSN: [id number] Arrival date & time: 01/15/21  1007      History   Chief Complaint Chief Complaint  Patient presents with   SEXUALLY TRANSMITTED DISEASE    HPI Dylan Gay is a 25 y.o. male.   Patient is here today for STD screening.  He reports that he has a rash to both of his upper leg and groin area.  He states that rash is itchy at times.  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  He denies any penile discharge.  He denies any abdominal pain, nausea, vomiting.  He has not had any dysuria.  He does not report any known contact with STD.    History reviewed. No pertinent past medical history.  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 No pertinent surgical history.     Home Medications    Prior to Admission medications   Medication Sig Start Date End Date Taking? Authorizing Provider  doxycycline (VIBRA-TABS) 100 MG tablet Take 1 tablet (100 mg total) by mouth every 12 (twelve) hours. 03/26/20   Jesse Sans, MD  doxycycline (VIBRA-TABS) 100 MG tablet TAKE 1 TABLET BY MOUTH EVERY 12 (TWELVE) HOURS 03/26/20 03/26/21  Jesse Sans, MD  erythromycin ophthalmic ointment PLACE A 1/2 INCH RIBBON OF OINTMENT INTO THE LOWER EYELIDS FOR 7 DAYS. 06/29/20 06/29/21  Rhys Martini, PA-C  hydrOXYzine (ATARAX/VISTARIL) 50 MG tablet Take 1 tablet (50 mg total) by mouth 3 (three) times daily as needed for anxiety. 03/26/20   Jesse Sans, MD  hydrOXYzine (ATARAX/VISTARIL) 50 MG tablet TAKE 1 TABLET BY MOUTH 3 (THREE) TIMES DAILY AS NEEDED FOR ANXIETY. 03/26/20 03/26/21  Jesse Sans, MD  ibuprofen (ADVIL,MOTRIN) 400 MG tablet Take 1 tablet (400 mg total) by mouth every 6 (six) hours as needed for moderate  pain. Patient not taking: Reported on 03/23/2020 08/26/15   Gayla Doss, MD  QUEtiapine (SEROQUEL) 50 MG tablet Take 1 tablet (50 mg total) by mouth at bedtime. 03/26/20   Jesse Sans, MD  QUEtiapine (SEROQUEL) 50 MG tablet TAKE 1 TABLET BY MOUTH AT BEDTIME. 03/26/20 03/26/21  Jesse Sans, MD    Family History History reviewed. No pertinent family history.  Social History Social History   Tobacco Use   Smoking status: Former    Types: Cigarettes   Smokeless tobacco: Never  Vaping Use   Vaping Use: Never used  Substance Use Topics   Alcohol use: Yes    Comment: not daily    Drug use: Yes    Types: Marijuana, Cocaine     Allergies   Patient has no known allergies.   Review of Systems Review of Systems  Constitutional:  Negative for chills and fever.  Eyes:  Negative for discharge and redness.  Respiratory:  Negative for shortness of breath.   Gastrointestinal:  Negative for abdominal pain, nausea and vomiting.  Genitourinary:  Negative for dysuria and penile discharge.  Skin:  Positive for rash.    Physical Exam Triage Vital Signs ED Triage Vitals [01/15/21 1036]  Enc Vitals Group     BP      Pulse      Resp      Temp  Temp src      SpO2      Weight      Height      Head Circumference      Peak Flow      Pain Score 0     Pain Loc      Pain Edu?      Excl. in GC?    No data found.  Updated Vital Signs BP 124/82 (BP Location: Left Arm)   Pulse 86   Temp 98.1 F (36.7 C) (Oral)   SpO2 99%    Physical Exam Vitals and nursing note reviewed.  Constitutional:      General: He is not in acute distress.    Appearance: Normal appearance. He is not ill-appearing.  HENT:     Head: Normocephalic and atraumatic.  Eyes:     Conjunctiva/sclera: Conjunctivae normal.  Cardiovascular:     Rate and Rhythm: Normal rate.  Pulmonary:     Effort: Pulmonary effort is normal.  Neurological:     Mental Status: He is alert.  Psychiatric:         Mood and Affect: Mood normal.        Behavior: Behavior normal.     UC Treatments / Results  Labs (all labs ordered are listed, but only abnormal results are displayed) Labs Reviewed  HIV ANTIBODY (ROUTINE TESTING W REFLEX)  HEPATITIS PANEL, ACUTE  RPR  CYTOLOGY, (ORAL, ANAL, URETHRAL) ANCILLARY ONLY    EKG   Radiology No results found.  Procedures Procedures (including critical care time)  Medications Ordered in UC Medications - No data to display  Initial Impression / Assessment and Plan / UC Course  I have reviewed the triage vital signs and the nursing notes.  Pertinent labs & imaging results that were available during my care of the patient were reviewed by me and considered in my medical decision making (see chart for details).  STD screen ordered as requested.  Discussed that rash sounds similar to jock itch and recommended an OTC antifungal cream for same.  Encouraged follow-up if no improvement or if symptoms worsen anyway.  Final Clinical Impressions(s) / UC Diagnoses   Final diagnoses:  Screening for STD (sexually transmitted disease)     Discharge Instructions      Recommend using mychart for results. We will call with any positive results. Try anti-fungal cream to cover rash in case this is caused by "jock itch" or tinea cruris. Follow up with any further concerns.      ED Prescriptions   None    PDMP not reviewed this encounter.   Tomi Bamberger, PA-C 01/15/21 1128

## 2021-01-16 LAB — RPR: RPR Ser Ql: NONREACTIVE

## 2021-01-18 LAB — CYTOLOGY, (ORAL, ANAL, URETHRAL) ANCILLARY ONLY
Chlamydia: NEGATIVE
Comment: NEGATIVE
Comment: NEGATIVE
Comment: NORMAL
Neisseria Gonorrhea: NEGATIVE
Trichomonas: NEGATIVE

## 2021-02-10 ENCOUNTER — Other Ambulatory Visit (HOSPITAL_COMMUNITY): Payer: Self-pay

## 2021-05-20 ENCOUNTER — Emergency Department (HOSPITAL_COMMUNITY): Payer: No Typology Code available for payment source

## 2021-05-20 ENCOUNTER — Encounter (HOSPITAL_COMMUNITY): Payer: Self-pay

## 2021-05-20 ENCOUNTER — Other Ambulatory Visit: Payer: Self-pay

## 2021-05-20 ENCOUNTER — Emergency Department (HOSPITAL_COMMUNITY)
Admission: EM | Admit: 2021-05-20 | Discharge: 2021-05-21 | Disposition: A | Discharge status: Home / Self Care | Payer: No Typology Code available for payment source | Attending: Emergency Medicine | Admitting: Emergency Medicine

## 2021-05-20 DIAGNOSIS — S93111A Dislocation of interphalangeal joint of right great toe, initial encounter: Secondary | ICD-10-CM | POA: Diagnosis not present

## 2021-05-20 DIAGNOSIS — R519 Headache, unspecified: Secondary | ICD-10-CM | POA: Insufficient documentation

## 2021-05-20 DIAGNOSIS — Y92410 Unspecified street and highway as the place of occurrence of the external cause: Secondary | ICD-10-CM | POA: Insufficient documentation

## 2021-05-20 DIAGNOSIS — S93106A Unspecified dislocation of unspecified toe(s), initial encounter: Secondary | ICD-10-CM

## 2021-05-20 DIAGNOSIS — S0081XA Abrasion of other part of head, initial encounter: Secondary | ICD-10-CM | POA: Diagnosis not present

## 2021-05-20 DIAGNOSIS — E876 Hypokalemia: Secondary | ICD-10-CM | POA: Diagnosis not present

## 2021-05-20 DIAGNOSIS — D72829 Elevated white blood cell count, unspecified: Secondary | ICD-10-CM | POA: Insufficient documentation

## 2021-05-20 DIAGNOSIS — S99921A Unspecified injury of right foot, initial encounter: Secondary | ICD-10-CM | POA: Diagnosis present

## 2021-05-20 DIAGNOSIS — Z23 Encounter for immunization: Secondary | ICD-10-CM | POA: Diagnosis not present

## 2021-05-20 DIAGNOSIS — R109 Unspecified abdominal pain: Secondary | ICD-10-CM | POA: Insufficient documentation

## 2021-05-20 LAB — COMPREHENSIVE METABOLIC PANEL
ALT: 28 U/L (ref 0–44)
AST: 34 U/L (ref 15–41)
Albumin: 4.2 g/dL (ref 3.5–5.0)
Alkaline Phosphatase: 85 U/L (ref 38–126)
Anion gap: 10 (ref 5–15)
BUN: 11 mg/dL (ref 6–20)
CO2: 24 mmol/L (ref 22–32)
Calcium: 9.2 mg/dL (ref 8.9–10.3)
Chloride: 103 mmol/L (ref 98–111)
Creatinine, Ser: 0.89 mg/dL (ref 0.61–1.24)
GFR, Estimated: >60 mL/min (ref >60)
Glucose, Bld: 111 mg/dL — ABNORMAL HIGH (ref 70–99)
Potassium: 3.3 mmol/L — ABNORMAL LOW (ref 3.5–5.1)
Sodium: 137 mmol/L (ref 135–145)
Total Bilirubin: 0.6 mg/dL (ref 0.3–1.2)
Total Protein: 7 g/dL (ref 6.5–8.1)

## 2021-05-20 LAB — CBC WITH DIFFERENTIAL/PLATELET
Abs Immature Granulocytes: 0.07 10*3/uL (ref 0.00–0.07)
Basophils Absolute: 0.1 10*3/uL (ref 0.0–0.1)
Basophils Relative: 0 %
Eosinophils Absolute: 0.1 10*3/uL (ref 0.0–0.5)
Eosinophils Relative: 1 %
HCT: 45.7 % (ref 39.0–52.0)
Hemoglobin: 15.1 g/dL (ref 13.0–17.0)
Immature Granulocytes: 1 %
Lymphocytes Relative: 17 %
Lymphs Abs: 2.4 10*3/uL (ref 0.7–4.0)
MCH: 28.2 pg (ref 26.0–34.0)
MCHC: 33 g/dL (ref 30.0–36.0)
MCV: 85.3 fL (ref 80.0–100.0)
Monocytes Absolute: 1.2 10*3/uL — ABNORMAL HIGH (ref 0.1–1.0)
Monocytes Relative: 8 %
Neutro Abs: 10.7 10*3/uL — ABNORMAL HIGH (ref 1.7–7.7)
Neutrophils Relative %: 73 %
Platelets: 279 10*3/uL (ref 150–400)
RBC: 5.36 MIL/uL (ref 4.22–5.81)
RDW: 12 % (ref 11.5–15.5)
WBC: 14.6 10*3/uL — ABNORMAL HIGH (ref 4.0–10.5)
nRBC: 0 % (ref 0.0–0.2)

## 2021-05-20 MED ORDER — TETANUS-DIPHTH-ACELL PERTUSSIS 5-2.5-18.5 LF-MCG/0.5 IM SUSY
0.5000 mL | PREFILLED_SYRINGE | Freq: Once | INTRAMUSCULAR | Status: AC
  Administered 2021-05-20: 0.5 mL via INTRAMUSCULAR
  Filled 2021-05-20: qty 0.5

## 2021-05-20 MED ORDER — MORPHINE SULFATE (PF) 4 MG/ML IV SOLN
4.0000 mg | Freq: Once | INTRAVENOUS | Status: AC
  Administered 2021-05-20: 4 mg via INTRAVENOUS
  Filled 2021-05-20: qty 1

## 2021-05-20 MED ORDER — IOHEXOL 300 MG/ML  SOLN
100.0000 mL | Freq: Once | INTRAMUSCULAR | Status: AC | PRN
  Administered 2021-05-20: 100 mL via INTRAVENOUS

## 2021-05-20 MED ORDER — NAPROXEN 500 MG PO TABS: 500.0000 mg | ORAL_TABLET | Freq: Two times a day (BID) | ORAL | 0 refills | Status: DC

## 2021-05-20 MED ORDER — METHOCARBAMOL 500 MG PO TABS
500.0000 mg | ORAL_TABLET | Freq: Two times a day (BID) | ORAL | 0 refills | Status: DC
Start: 2021-05-20 — End: 2022-05-02
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
## 2021-05-20 NOTE — Discharge Instructions (Addendum)
It was a pleasure taking care of you here in the emergency department today  Your initial x-ray that showed a dislocation of your toe on repeat x-ray was reduced which means is back in place.  Recommend ice, Tylenol Motrin for pain  Your forehead abrasion does not require suturing.  Would recommend keeping over-the-counter Neosporin or Polysporin on this  You will have generalized body aches over the next few days which is to be expected.  I have written you for a muscle laxer called Robaxin.  Take as prescribed.  Return for new or worsening symptoms peer

## 2022-04-28 ENCOUNTER — Ambulatory Visit (HOSPITAL_COMMUNITY): Payer: Self-pay

## 2022-05-02 ENCOUNTER — Encounter (HOSPITAL_COMMUNITY): Payer: Self-pay | Admitting: Emergency Medicine

## 2022-05-02 ENCOUNTER — Ambulatory Visit (HOSPITAL_COMMUNITY)
Admission: EM | Admit: 2022-05-02 | Discharge: 2022-05-02 | Disposition: A | Discharge status: Home / Self Care | Payer: Self-pay | Attending: Internal Medicine | Admitting: Internal Medicine

## 2022-05-02 DIAGNOSIS — L738 Other specified follicular disorders: Secondary | ICD-10-CM

## 2022-05-02 MED ORDER — CEPHALEXIN 500 MG PO CAPS: 500.0000 mg | ORAL_CAPSULE | Freq: Three times a day (TID) | ORAL | 0 refills | Status: AC

## 2022-05-02 NOTE — Discharge Instructions (Signed)
Your testicular lump is due to an infected hair follicle called "folliculitis".  Take keflex antibiotic as directed (3 times a day at breakfast, lunch, and dinner) for the next 7 days to treat this. Perform warm compresses to the area as well for further relief of symptoms.  To find a primary care provider go to [email or web address] and follow the prompts to schedule a new patient appointment for primary care.  Someone from [facility] health will be reaching out to you either via MyChart or phone call to help you set up a primary care provider appointment as well.  It is very important to have a primary care provider to manage your overall health and prevent/manage chronic medical conditions.   For your sleep problems, you may follow-up with [facility]. I have attached information for this outpatient clinic to your discharge paperwork. They will be able to help you with your anxiety and insomnia.   If you develop any new or worsening symptoms or do not improve in the next 2 to 3 days, please return.  If your symptoms are severe, please go to the emergency room.  Follow-up with your primary care provider for further evaluation and management of your symptoms as well as ongoing wellness visits.  I hope you feel better!

## 2022-05-02 NOTE — ED Triage Notes (Signed)
Pt reports having itching on testicles for over a month. Reports had STD testing and everything was good. Tried creams for jock itch that hasn't helped.  Pt reports that noticed on lump on left testicle for past 3-4 weeks. Denies pain.   Pt adds that been having trouble sleeping recently.

## 2022-05-02 NOTE — ED Provider Notes (Signed)
[facility]    CSN: [id number] Arrival date & time: 05/02/22  1641      History   Chief Complaint Chief Complaint  Patient presents with   Pruritis    HPI Dylan Gay is a 27 y.o. male.   Patient presents to urgent care for evaluation of "bump" to the left testicle that has been present for the last 3 to 4 weeks.  He denies pain to the testicles and recent injuries.  Denies recent new sexual partners, penile discharge, and urinary symptoms.  He is an uncircumcised male but states that he does not have any redness to the tip of the penis and cleans adequately regularly.  He was tested for STDs when symptoms first started and all of his testing came back negative.  The "bump" has not grown in size over the last month but is slightly red.  He states both of his testicles are very itchy and this is irritating.  He has been placing over-the-counter jock itch cream (clotrimazole) to the area with some relief.  States the clotrimazole cream helps the area not be quite so red but he still has itching.  Denies feeling any internal lumps or bumps to the testicles.  No history of testicular problems.  States he has not shaved or waxed the pubic area recently.  No recent antibiotics.  The bump is not painful.  He does not have a history of genital herpes, HIV, or syphilis.     History reviewed. No pertinent past medical history.  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 No pertinent surgical history.     Home Medications    Prior to Admission medications   Medication Sig Start Date End Date Taking? Authorizing Provider  cephALEXin (KEFLEX) 500 MG capsule Take 1 capsule (500 mg total) by mouth 3 (three) times daily for 7 days. 05/02/22 05/09/22 Yes Mayukha Symmonds, Stasia Cavalier, FNP   ibuprofen (ADVIL,MOTRIN) 400 MG tablet Take 1 tablet (400 mg total) by mouth every 6 (six) hours as needed for moderate pain. Patient not taking: Reported on 03/23/2020 08/26/15   Joanne Gavel, MD    Family History No family history on file.  Social History Social History   Tobacco Use   Smoking status: Former    Types: Cigarettes   Smokeless tobacco: Never  Vaping Use   Vaping Use: Never used  Substance Use Topics   Alcohol use: Yes    Comment: not daily    Drug use: Yes    Types: Marijuana, Cocaine     Allergies   Patient has no known allergies.   Review of Systems Review of Systems Per HPI  Physical Exam Triage Vital Signs ED Triage Vitals  Enc Vitals Group     BP 05/02/22 1727 122/80     Pulse Rate 05/02/22 1727 70     Resp 05/02/22 1727 14     Temp 05/02/22 1727 98.1 F (36.7 C)     Temp Source 05/02/22 1727 Oral     SpO2 05/02/22 1727 95 %     Weight --      Height --      Head Circumference --      Peak Flow --      Pain Score 05/02/22 1725 0     Pain Loc --  Pain Edu? --      Excl. in [place]? --    No data found.  Updated Vital Signs BP 122/80 (BP Location: Right Arm)   Pulse 70   Temp 98.1 F (36.7 C) (Oral)   Resp 14   SpO2 95%   Visual Acuity Right Eye Distance:   Left Eye Distance:   Bilateral Distance:    Right Eye Near:   Left Eye Near:    Bilateral Near:     Physical Exam Vitals and nursing note reviewed. Exam conducted with a chaperone present [facility], CMA).  Constitutional:      Appearance: He is not ill-appearing or toxic-appearing.  HENT:     Head: Normocephalic and atraumatic.     Right Ear: Hearing and external ear normal.     Left Ear: Hearing and external ear normal.     Nose: Nose normal.     Mouth/Throat:     Lips: Pink.  Eyes:     General: Lids are normal. Vision grossly intact. Gaze aligned appropriately.     Extraocular Movements: Extraocular movements intact.     Conjunctiva/sclera: Conjunctivae normal.   Cardiovascular:     Heart sounds: S1 normal and S2 normal.  Pulmonary:     Breath sounds: Normal air entry.  Genitourinary:    Pubic Area: No rash.      Penis: Normal and uncircumcised.      Testes:        Right: Mass or tenderness not present.        Left: Mass or tenderness not present.     Epididymis:     Right: Normal.     Left: Normal.     Tanner stage (genital): 5.       Comments: Testicles appear and feel normal other than singular localized pustule to the left testicle as indicated area above.  No erythema or rash to the testicular region.  Penis is normal and without rash or lesions.  No palpable testicular masses or pain. Skin:    General: Skin is warm and dry.     Capillary Refill: Capillary refill takes less than 2 seconds.     Findings: No rash.  Neurological:     General: No focal deficit present.     Mental Status: He is alert and oriented to person, place, and time. Mental status is at baseline.     Cranial Nerves: No dysarthria or facial asymmetry.  Psychiatric:        Mood and Affect: Mood normal.        Speech: Speech normal.        Behavior: Behavior normal.        Thought Content: Thought content normal.        Judgment: Judgment normal.      UC Treatments / Results  Labs (all labs ordered are listed, but only abnormal results are displayed) Labs Reviewed - No data to display  EKG   Radiology No results found.  Procedures Procedures (including critical care time)  Medications Ordered in UC Medications - No data to display  Initial Impression / Assessment and Plan / UC Course  I have reviewed the triage vital signs and the nursing notes.  Pertinent labs & imaging results that were available during my care of the patient were reviewed by me and considered in my medical decision making (see chart for details).   1.  Bacterial folliculitis Presentation is consistent with acute bacterial folliculitis of the left testicle.  Keflex  3 times  daily for the next 7 days sent to pharmacy to be taken as prescribed with food.  PCP assistance initiated.  Deferred urinary and STD testing today due to lack of urinary symptoms and stable genitourinary exam findings.  Patient also briefly mentions history of anxiety and insomnia.  He would like assistance with thought racing at nighttime and difficulty falling asleep/staying asleep.  He states he tosses and turns a lot in his sleep and would like medication to help him with this.  Patient given resources for [facility] behavioral health center and advised to go there to receive more assistance with anxiety and sleep symptoms.  Discussed physical exam and available lab work findings in clinic with patient.  Counseled patient regarding appropriate use of medications and potential side effects for all medications recommended or prescribed today. Discussed red flag signs and symptoms of worsening condition,when to call the PCP office, return to urgent care, and when to seek higher level of care in the emergency department. Patient verbalizes understanding and agreement with plan. All questions answered. Patient discharged in stable condition.    Final Clinical Impressions(s) / UC Diagnoses   Final diagnoses:  Bacterial folliculitis     Discharge Instructions      Your testicular lump is due to an infected hair follicle called "folliculitis".  Take keflex antibiotic as directed (3 times a day at breakfast, lunch, and dinner) for the next 7 days to treat this. Perform warm compresses to the area as well for further relief of symptoms.  To find a primary care provider go to [email or web address] and follow the prompts to schedule a new patient appointment for primary care.  Someone from [facility] health will be reaching out to you either via MyChart or phone call to help you set up a primary care provider appointment as well.  It is very important to have a primary care provider to manage  your overall health and prevent/manage chronic medical conditions.   For your sleep problems, you may follow-up with [facility]. I have attached information for this outpatient clinic to your discharge paperwork. They will be able to help you with your anxiety and insomnia.   If you develop any new or worsening symptoms or do not improve in the next 2 to 3 days, please return.  If your symptoms are severe, please go to the emergency room.  Follow-up with your primary care provider for further evaluation and management of your symptoms as well as ongoing wellness visits.  I hope you feel better!   ED Prescriptions     Medication Sig Dispense Auth. Provider   cephALEXin (KEFLEX) 500 MG capsule Take 1 capsule (500 mg total) by mouth 3 (three) times daily for 7 days. 21 capsule Carlisle Beers, FNP      PDMP not reviewed this encounter.   Carlisle Beers, [place] 05/02/22 1819

## 2022-11-15 ENCOUNTER — Other Ambulatory Visit: Payer: Self-pay

## 2022-11-15 ENCOUNTER — Emergency Department
Admission: EM | Admit: 2022-11-15 | Discharge: 2022-11-15 | Disposition: A | Discharge status: Home / Self Care | Payer: Self-pay | Attending: Student in an Organized Health Care Education/Training Program | Admitting: Student in an Organized Health Care Education/Training Program

## 2022-11-15 ENCOUNTER — Encounter: Payer: Self-pay | Admitting: Emergency Medicine

## 2022-11-15 DIAGNOSIS — M791 Myalgia, unspecified site: Secondary | ICD-10-CM | POA: Insufficient documentation

## 2022-11-15 DIAGNOSIS — Z1152 Encounter for screening for COVID-19: Secondary | ICD-10-CM | POA: Insufficient documentation

## 2022-11-15 DIAGNOSIS — E860 Dehydration: Secondary | ICD-10-CM | POA: Insufficient documentation

## 2022-11-15 DIAGNOSIS — T671XXA Heat syncope, initial encounter: Secondary | ICD-10-CM | POA: Insufficient documentation

## 2022-11-15 DIAGNOSIS — R109 Unspecified abdominal pain: Secondary | ICD-10-CM | POA: Insufficient documentation

## 2022-11-15 DIAGNOSIS — E875 Hyperkalemia: Secondary | ICD-10-CM | POA: Insufficient documentation

## 2022-11-15 DIAGNOSIS — D72829 Elevated white blood cell count, unspecified: Secondary | ICD-10-CM | POA: Insufficient documentation

## 2022-11-15 DIAGNOSIS — R112 Nausea with vomiting, unspecified: Secondary | ICD-10-CM | POA: Insufficient documentation

## 2022-11-15 LAB — URINE DRUG SCREEN, QUALITATIVE (ARMC ONLY)
Amphetamines, Ur Screen: NOT DETECTED
Barbiturates, Ur Screen: NOT DETECTED
Benzodiazepine, Ur Scrn: NOT DETECTED
Cannabinoid 50 Ng, Ur ~~LOC~~: POSITIVE — AB
Cocaine Metabolite,Ur ~~LOC~~: NOT DETECTED
MDMA (Ecstasy)Ur Screen: NOT DETECTED
Methadone Scn, Ur: NOT DETECTED
Opiate, Ur Screen: NOT DETECTED
Phencyclidine (PCP) Ur S: NOT DETECTED
Tricyclic, Ur Screen: NOT DETECTED

## 2022-11-15 LAB — CBC WITH DIFFERENTIAL/PLATELET
Abs Immature Granulocytes: 0.17 10*3/uL — ABNORMAL HIGH (ref 0.00–0.07)
Basophils Absolute: 0.1 10*3/uL (ref 0.0–0.1)
Basophils Relative: 0 %
Eosinophils Absolute: 0.1 10*3/uL (ref 0.0–0.5)
Eosinophils Relative: 0 %
HCT: 48.6 % (ref 39.0–52.0)
Hemoglobin: 16.2 g/dL (ref 13.0–17.0)
Immature Granulocytes: 1 %
Lymphocytes Relative: 19 %
Lymphs Abs: 3.1 10*3/uL (ref 0.7–4.0)
MCH: 27.6 pg (ref 26.0–34.0)
MCHC: 33.3 g/dL (ref 30.0–36.0)
MCV: 82.8 fL (ref 80.0–100.0)
Monocytes Absolute: 1.6 10*3/uL — ABNORMAL HIGH (ref 0.1–1.0)
Monocytes Relative: 10 %
Neutro Abs: 11.4 10*3/uL — ABNORMAL HIGH (ref 1.7–7.7)
Neutrophils Relative %: 70 %
Platelets: 325 10*3/uL (ref 150–400)
RBC: 5.87 MIL/uL — ABNORMAL HIGH (ref 4.22–5.81)
RDW: 12.3 % (ref 11.5–15.5)
WBC: 16.4 10*3/uL — ABNORMAL HIGH (ref 4.0–10.5)
nRBC: 0 % (ref 0.0–0.2)

## 2022-11-15 LAB — BASIC METABOLIC PANEL
Anion gap: 8 (ref 5–15)
BUN: 13 mg/dL (ref 6–20)
CO2: 23 mmol/L (ref 22–32)
Calcium: 8.4 mg/dL — ABNORMAL LOW (ref 8.9–10.3)
Chloride: 106 mmol/L (ref 98–111)
Creatinine, Ser: 1.11 mg/dL (ref 0.61–1.24)
GFR, Estimated: >60 mL/min (ref >60)
Glucose, Bld: 103 mg/dL — ABNORMAL HIGH (ref 70–99)
Potassium: 4 mmol/L (ref 3.5–5.1)
Sodium: 137 mmol/L (ref 135–145)

## 2022-11-15 LAB — COMPREHENSIVE METABOLIC PANEL
ALT: 43 U/L (ref 0–44)
AST: 46 U/L — ABNORMAL HIGH (ref 15–41)
Albumin: 4.7 g/dL (ref 3.5–5.0)
Alkaline Phosphatase: 85 U/L (ref 38–126)
Anion gap: 13 (ref 5–15)
BUN: 17 mg/dL (ref 6–20)
CO2: 22 mmol/L (ref 22–32)
Calcium: 9.7 mg/dL (ref 8.9–10.3)
Chloride: 99 mmol/L (ref 98–111)
Creatinine, Ser: 1.81 mg/dL — ABNORMAL HIGH (ref 0.61–1.24)
GFR, Estimated: 52 mL/min — ABNORMAL LOW (ref >60)
Glucose, Bld: 136 mg/dL — ABNORMAL HIGH (ref 70–99)
Potassium: 3.5 mmol/L (ref 3.5–5.1)
Sodium: 134 mmol/L — ABNORMAL LOW (ref 135–145)
Total Bilirubin: 0.8 mg/dL (ref 0.3–1.2)
Total Protein: 8.1 g/dL (ref 6.5–8.1)

## 2022-11-15 LAB — URINALYSIS, ROUTINE W REFLEX MICROSCOPIC
Bilirubin Urine: NEGATIVE
Glucose, UA: NEGATIVE mg/dL
Hgb urine dipstick: NEGATIVE
Ketones, ur: NEGATIVE mg/dL
Leukocytes,Ua: NEGATIVE
Nitrite: NEGATIVE
Protein, ur: 100 mg/dL — AB
Specific Gravity, Urine: 1.025 (ref 1.005–1.030)
pH: 5 (ref 5.0–8.0)

## 2022-11-15 LAB — CBG MONITORING, ED: Glucose-Capillary: 133 mg/dL — ABNORMAL HIGH (ref 70–99)

## 2022-11-15 LAB — CK
Total CK: 565 U/L — ABNORMAL HIGH (ref 49–397)
Total CK: 569 U/L — ABNORMAL HIGH (ref 49–397)

## 2022-11-15 LAB — SARS CORONAVIRUS 2 BY RT PCR: SARS Coronavirus 2 by RT PCR: NEGATIVE

## 2022-11-15 MED ORDER — ACETAMINOPHEN 500 MG PO TABS
1000.0000 mg | ORAL_TABLET | Freq: Once | ORAL | Status: AC
  Administered 2022-11-15: 1000 mg via ORAL
  Filled 2022-11-15: qty 2

## 2022-11-15 MED ORDER — SODIUM CHLORIDE 0.9 % IV BOLUS
1000.0000 mL | Freq: Once | INTRAVENOUS | Status: AC
  Administered 2022-11-15: 1000 mL via INTRAVENOUS

## 2022-11-15 MED ORDER — AMOXICILLIN 500 MG PO CAPS: 500.0000 mg | ORAL_CAPSULE | Freq: Three times a day (TID) | ORAL | 0 refills | Status: AC

## 2022-11-15 NOTE — ED Provider Notes (Signed)
[facility] Provider Note    Event Date/Time   First MD Initiated Contact with Patient 11/15/22 1514     (approximate)   History   Weakness, Nausea, Emesis, and Dizziness   HPI  Dylan Gay is a 27 y.o. male who presents to the ER for evaluation of generalized malaise nausea crampy abdominal pain vomiting and congestion.  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  Significant heat advisory today.  After lunch started feeling ill and vomited once with crampy abdominal pain.  Upon arrival to the ER he is feeling better now being in a cooler environment.  Is complaining of muscle aches.     Physical Exam   Triage Vital Signs: ED Triage Vitals [11/15/22 1523]  Encounter Vitals Group     BP (!) 143/97     Systolic BP Percentile      Diastolic BP Percentile      Pulse Rate (!) 110     Resp 16     Temp 98.4 F (36.9 C)     Temp Source Oral     SpO2 99 %     Weight 209 lb 3.5 oz (94.9 kg)     Height 5\' 5"  (1.651 m)     Head Circumference      Peak Flow      Pain Score 0     Pain Loc      Pain Education      Exclude from Growth Chart     Most recent vital signs: Vitals:   11/15/22 1703 11/15/22 1730  BP: 119/79 (!) 117/91  Pulse: 92 95  Resp: 10 12  Temp:    SpO2: 100% 97%     Constitutional: Alert  Eyes: Conjunctivae are normal.  Head: Atraumatic. Nose: ++ congestion/rhinnorhea. Mouth/Throat: Mucous membranes are moist.   Neck: Painless ROM.  Cardiovascular:   Good peripheral circulation. Respiratory: Normal respiratory effort.  No retractions.  Gastrointestinal: Soft and nontender.  Musculoskeletal:  no deformity Neurologic:  MAE spontaneously. No gross focal neurologic deficits are appreciated.  Skin:  Skin is warm, dry and intact. No rash noted. Psychiatric: Mood and affect are normal. Speech and behavior are normal.    ED  Results / Procedures / Treatments   Labs (all labs ordered are listed, but only abnormal results are displayed) Labs Reviewed  CBC WITH DIFFERENTIAL/PLATELET - Abnormal; Notable for the following components:      Result Value   WBC 16.4 (*)    RBC 5.87 (*)    Neutro Abs 11.4 (*)    Monocytes Absolute 1.6 (*)    Abs Immature Granulocytes 0.17 (*)    All other components within normal limits  COMPREHENSIVE METABOLIC PANEL - Abnormal; Notable for the following components:   Sodium 134 (*)    Glucose, Bld 136 (*)    Creatinine, Ser 1.81 (*)    AST 46 (*)    GFR, Estimated 52 (*)    All other components within normal limits  CK - Abnormal; Notable for the following components:   Total CK 565 (*)    All other components within normal limits  URINE DRUG SCREEN, QUALITATIVE ([facility] ONLY) - Abnormal; Notable for the following components:   Cannabinoid 50 Ng, Ur [place] POSITIVE (*)    All other components within normal limits  URINALYSIS, ROUTINE W REFLEX MICROSCOPIC - Abnormal; Notable for the following components:  Color, Urine YELLOW (*)    APPearance HAZY (*)    Protein, ur 100 (*)    Bacteria, UA FEW (*)    All other components within normal limits  CK - Abnormal; Notable for the following components:   Total CK 569 (*)    All other components within normal limits  BASIC METABOLIC PANEL - Abnormal; Notable for the following components:   Glucose, Bld 103 (*)    Calcium 8.4 (*)    All other components within normal limits  CBG MONITORING, ED - Abnormal; Notable for the following components:   Glucose-Capillary 133 (*)    All other components within normal limits  SARS CORONAVIRUS 2 BY RT PCR     EKG  ED ECG REPORT I, Willy Eddy, the attending physician, personally viewed and interpreted this ECG.   Date: 11/15/2022  EKG Time: 15:12  Rate: 110  Rhythm: sinus  Axis: normal  Intervals: normal  ST&T Change: ber, no depressions    RADIOLOGY Please see ED Course for  my review and interpretation.  I personally reviewed all radiographic images ordered to evaluate for the above acute complaints and reviewed radiology reports and findings.  These findings were personally discussed with the patient.  Please see medical record for radiology report.    PROCEDURES:  Critical Care performed: No  Procedures   MEDICATIONS ORDERED IN ED: Medications  sodium chloride 0.9 % bolus 1,000 mL (0 mLs Intravenous Stopped 11/15/22 1635)  sodium chloride 0.9 % bolus 1,000 mL (0 mLs Intravenous Stopped 11/15/22 1725)  sodium chloride 0.9 % bolus 1,000 mL (0 mLs Intravenous Stopped 11/15/22 1800)  acetaminophen (TYLENOL) tablet 1,000 mg (1,000 mg Oral Given 11/15/22 1904)     IMPRESSION / MDM / ASSESSMENT AND PLAN / ED COURSE  I reviewed the triage vital signs and the nursing notes.                              Differential diagnosis includes, but is not limited to, dehydration, heat related illness, heat syncope, dysrhythmia, electrolyte abnormality, COVID, sinusitis  Patient presenting to the ER for evaluation of symptoms as described above.  Based on symptoms, risk factors and considered above differential, this presenting complaint could reflect a potentially life-threatening illness therefore the patient will be placed on continuous pulse oximetry and telemetry for monitoring.  Laboratory evaluation will be sent to evaluate for the above complaints.      Clinical Course as of 11/15/22 1910  Wed Nov 15, 2022  1620 K mildly elevated at 565.  Leukocytosis.  Possible mild rhabdo will give additional IV fluids.  Check urine. [PR]  1658 COVID-negative.  Few bacteria in urine but no nitrites leuk esterase or WBC.  No RBCs.  Suspect this is heat related illness. [PR]  1811 Patient remains hemodynamically stable.  Periods improved after IV fluids.  After fluid resuscitation will repeat metabolic and CK, if improving given his well appearance clinically I think will be  appropriate for discharge with oral rehydration.  If worsening may require hospitalization. [PR]  1901 Look improved after IV hydration.  Mildly persistent elevated CK without significant change.  He feels clinically better. [PR]  1901 No muscular pain.  Urine is clear.  Does appear appropriate for outpatient treatment. [PR]    Clinical Course User Index [PR] Willy Eddy, MD     FINAL CLINICAL IMPRESSION(S) / ED DIAGNOSES   Final diagnoses:  Dehydration  Heat  syncope, initial encounter     Rx / DC Orders   ED Discharge Orders          Ordered    amoxicillin (AMOXIL) 500 MG capsule  3 times daily        11/15/22 1900             Note:  This document was prepared using Dragon voice recognition software and may include unintentional dictation errors.    Willy Eddy, MD 11/15/22 1910

## 2022-11-15 NOTE — ED Triage Notes (Signed)
Pt ems from home for nausea, vomiting, generalized muscle cramping. Vs for ems bp 80/palp, hr 120. Ems gave 500 ml ns, 500 ml LR.

## 2022-11-17 ENCOUNTER — Other Ambulatory Visit: Payer: Self-pay

## 2022-11-17 ENCOUNTER — Emergency Department
Admission: EM | Admit: 2022-11-17 | Discharge: 2022-11-18 | Disposition: A | Discharge status: Home / Self Care | Payer: Self-pay | Attending: Emergency Medicine | Admitting: Emergency Medicine

## 2022-11-17 ENCOUNTER — Encounter: Payer: Self-pay | Admitting: Emergency Medicine

## 2022-11-17 DIAGNOSIS — N179 Acute kidney failure, unspecified: Secondary | ICD-10-CM | POA: Insufficient documentation

## 2022-11-17 DIAGNOSIS — Z20822 Contact with and (suspected) exposure to covid-19: Secondary | ICD-10-CM | POA: Insufficient documentation

## 2022-11-17 DIAGNOSIS — E860 Dehydration: Secondary | ICD-10-CM | POA: Insufficient documentation

## 2022-11-17 DIAGNOSIS — T672XXA Heat cramp, initial encounter: Secondary | ICD-10-CM | POA: Insufficient documentation

## 2022-11-17 DIAGNOSIS — M6282 Rhabdomyolysis: Secondary | ICD-10-CM

## 2022-11-17 LAB — BASIC METABOLIC PANEL
Anion gap: 12 (ref 5–15)
Anion gap: 10 (ref 5–15)
BUN: 16 mg/dL (ref 6–20)
BUN: 14 mg/dL (ref 6–20)
CO2: 22 mmol/L (ref 22–32)
CO2: 22 mmol/L (ref 22–32)
Calcium: 9.1 mg/dL (ref 8.9–10.3)
Calcium: 8.7 mg/dL — ABNORMAL LOW (ref 8.9–10.3)
Chloride: 99 mmol/L (ref 98–111)
Chloride: 102 mmol/L (ref 98–111)
Creatinine, Ser: 1.48 mg/dL — ABNORMAL HIGH (ref 0.61–1.24)
Creatinine, Ser: 1.28 mg/dL — ABNORMAL HIGH (ref 0.61–1.24)
GFR, Estimated: >60 mL/min (ref >60)
GFR, Estimated: >60 mL/min (ref >60)
Glucose, Bld: 93 mg/dL (ref 70–99)
Glucose, Bld: 155 mg/dL — ABNORMAL HIGH (ref 70–99)
Potassium: 3.6 mmol/L (ref 3.5–5.1)
Potassium: 3 mmol/L — ABNORMAL LOW (ref 3.5–5.1)
Sodium: 133 mmol/L — ABNORMAL LOW (ref 135–145)
Sodium: 134 mmol/L — ABNORMAL LOW (ref 135–145)

## 2022-11-17 LAB — CBC
HCT: 43.4 % (ref 39.0–52.0)
Hemoglobin: 14.7 g/dL (ref 13.0–17.0)
MCH: 27.6 pg (ref 26.0–34.0)
MCHC: 33.9 g/dL (ref 30.0–36.0)
MCV: 81.4 fL (ref 80.0–100.0)
Platelets: 299 10*3/uL (ref 150–400)
RBC: 5.33 MIL/uL (ref 4.22–5.81)
RDW: 12.4 % (ref 11.5–15.5)
WBC: 15.9 10*3/uL — ABNORMAL HIGH (ref 4.0–10.5)
nRBC: 0 % (ref 0.0–0.2)

## 2022-11-17 LAB — MAGNESIUM: Magnesium: 2.1 mg/dL (ref 1.7–2.4)

## 2022-11-17 LAB — URINALYSIS, ROUTINE W REFLEX MICROSCOPIC
Bilirubin Urine: NEGATIVE
Glucose, UA: NEGATIVE mg/dL
Ketones, ur: NEGATIVE mg/dL
Leukocytes,Ua: NEGATIVE
Nitrite: NEGATIVE
Protein, ur: 30 mg/dL — AB
Specific Gravity, Urine: 1.01 (ref 1.005–1.030)
pH: 5 (ref 5.0–8.0)

## 2022-11-17 LAB — CK
Total CK: 1181 U/L — ABNORMAL HIGH (ref 49–397)
Total CK: 1499 U/L — ABNORMAL HIGH (ref 49–397)

## 2022-11-17 LAB — RESP PANEL BY RT-PCR (FLU A&B, COVID) ARPGX2
Influenza A by PCR: NEGATIVE
Influenza B by PCR: NEGATIVE
SARS Coronavirus 2 by RT PCR: NEGATIVE

## 2022-11-17 MED ORDER — SODIUM CHLORIDE 0.9 % IV BOLUS
500.0000 mL | Freq: Once | INTRAVENOUS | Status: AC
  Administered 2022-11-17: 500 mL via INTRAVENOUS

## 2022-11-17 MED ORDER — LACTATED RINGERS IV BOLUS
1000.0000 mL | Freq: Once | INTRAVENOUS | Status: AC
  Administered 2022-11-17: 1000 mL via INTRAVENOUS

## 2022-11-17 MED ORDER — SODIUM CHLORIDE 0.9 % IV BOLUS
1000.0000 mL | Freq: Once | INTRAVENOUS | Status: AC
  Administered 2022-11-17: 1000 mL via INTRAVENOUS

## 2022-11-17 MED ORDER — POTASSIUM CHLORIDE CRYS ER 20 MEQ PO TBCR
40.0000 meq | EXTENDED_RELEASE_TABLET | Freq: Once | ORAL | Status: AC
  Administered 2022-11-17: 40 meq via ORAL
  Filled 2022-11-17: qty 2

## 2022-11-17 NOTE — ED Triage Notes (Signed)
Pt presents via [facility] from work due to heat exposure and dehydration. Pt was seen here 2 days ago for same. 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 Pt had some significant muscle spasm en route with EMS. Pt received NS from EMS and endorses feeling better.  A&Ox4 at this time. Denies CP or SOB.

## 2022-11-17 NOTE — ED Provider Notes (Signed)
[facility] Provider Note    Event Date/Time   First MD Initiated Contact with Patient 11/17/22 1710     (approximate)   History   Dehydration   HPI  Dylan Gay is a 27 y.o. male reports he started having severe muscle aches  Patient working in [employer], he he reports for about 6 or so hours today he was upstairs in an attic that was hot.  After completing work he felt fatigue and then started developing cramping in his abdominal muscles as well as his arms and legs.  He reports the same happened to him and he was seen in the ER about 3 days ago  He reports hydration he drinks about 10 bottles of water throughout his shift today.  He reports he did not urinate during work today for about 6 hours  No fevers or chills.  No nausea or vomiting.  Reports cramping is starting to improve of note he is already received approximately 2 L of normal saline  He did well after leaving the ER last time, and but he reports the symptoms came back today after hours working in the heat     Physical Exam   Triage Vital Signs: ED Triage Vitals  Encounter Vitals Group     BP 11/17/22 1710 125/83     Systolic BP Percentile --      Diastolic BP Percentile --      Pulse Rate 11/17/22 1710 (!) 104     Resp 11/17/22 1710 16     Temp 11/17/22 1710 97.9 F (36.6 C)     Temp Source 11/17/22 1710 Oral     SpO2 11/17/22 1710 99 %     Weight 11/17/22 1712 208 lb 8.9 oz (94.6 kg)     Height 11/17/22 1712 5\' 5"  (1.651 m)     Head Circumference --      Peak Flow --      Pain Score 11/17/22 1712 0     Pain Loc --      Pain Education --      Exclude from Growth Chart --   The patient is not febrile  Most recent vital signs: Vitals:   11/17/22 2225 11/17/22 2348  BP: 122/76 123/82  Pulse: 73 87  Resp: 16 16  Temp:    SpO2: 98% 97%     General: Awake, no distress.  This membranes appear slightly dry CV:  Good peripheral perfusion.  Normal tones and  rate Resp:  Normal effort.  Clear bilateral Abd:  No distention.  Soft nontender nondistended Other:  Slight cramping in the muscles of the arms and legs as he tries to sit himself up he reports his abdominal muscles feel crampy.  He does relate he is starting to feel quite a bit better already after receiving IV fluid with the paramedics   ED Results / Procedures / Treatments   Labs (all labs ordered are listed, but only abnormal results are displayed) Labs Reviewed  CBC - Abnormal; Notable for the following components:      Result Value   WBC 15.9 (*)    All other components within normal limits  URINALYSIS, ROUTINE W REFLEX MICROSCOPIC - Abnormal; Notable for the following components:   Color, Urine YELLOW (*)    APPearance HAZY (*)    Hgb urine dipstick MODERATE (*)    Protein, ur 30 (*)    Bacteria, UA RARE (*)    All other components  within normal limits  BASIC METABOLIC PANEL - Abnormal; Notable for the following components:   Sodium 133 (*)    Creatinine, Ser 1.48 (*)    All other components within normal limits  CK - Abnormal; Notable for the following components:   Total CK 1,181 (*)    All other components within normal limits  BASIC METABOLIC PANEL - Abnormal; Notable for the following components:   Sodium 134 (*)    Potassium 3.0 (*)    Glucose, Bld 155 (*)    Creatinine, Ser 1.28 (*)    Calcium 8.7 (*)    All other components within normal limits  CK - Abnormal; Notable for the following components:   Total CK 1,499 (*)    All other components within normal limits  RESP PANEL BY RT-PCR (FLU A&B, COVID) ARPGX2  MAGNESIUM  BASIC METABOLIC PANEL  CK   PROCEDURES:  Critical Care performed: No  Procedures   MEDICATIONS ORDERED IN ED: Medications  lactated ringers bolus 1,000 mL (0 mLs Intravenous Stopped 11/17/22 1933)  sodium chloride 0.9 % bolus 1,000 mL (0 mLs Intravenous Stopped 11/17/22 2334)  potassium chloride SA (KLOR-CON M) CR tablet 40 mEq (40 mEq  Oral Given 11/17/22 2344)  sodium chloride 0.9 % bolus 500 mL (500 mLs Intravenous New Bag/Given 11/17/22 2344)     IMPRESSION / MDM / ASSESSMENT AND PLAN / ED COURSE  I reviewed the triage vital signs and the nursing notes.                              Differential diagnosis includes, but is not limited to, heat cramps, heat exhaustion, dehydration, acute kidney injury, heat related illness, dehydration etc, rhabdomyolysis  Patient improving already with fluids.  He does appear to have evidence suggesting heat cramps with heat exposure as cause.  This is a second such event in the last 3 days.  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  ----------------------------------------- 12:14 AM on 11/18/2022 ----------------------------------------- Consulted hospitalist, discussed case with Dr. Para March.  Her recommendation is for the patient to continue IV fluid in the ER and observe for additional IV fluid  Ongoing care assigned to Dr. Erma Heritage.  Follow-up on repeat metabolic panel and CK after additional fluid bolus which I have ordered.  Patient aware agreeable with plan for further observation in the ER.  He is awake alert oriented feeling improved without distress at this time.  If renal function preserved and no major increase in CK would anticipate discharge to home.     FINAL CLINICAL IMPRESSION(S) / ED DIAGNOSES   Final diagnoses:  Heat cramp, initial encounter  Non-traumatic rhabdomyolysis  Dehydration, mild     Rx / DC Orders   ED Discharge Orders     None        Note:  This document was prepared using Dragon voice recognition software and  may include unintentional dictation errors.   Sharyn Creamer, MD 11/18/22 [phone]

## 2022-11-18 DIAGNOSIS — M6282 Rhabdomyolysis: Secondary | ICD-10-CM

## 2022-11-18 DIAGNOSIS — N179 Acute kidney failure, unspecified: Secondary | ICD-10-CM | POA: Insufficient documentation

## 2022-11-18 MED ORDER — LACTATED RINGERS IV SOLN: INTRAVENOUS | Status: DC

## 2022-11-18 NOTE — ED Provider Notes (Addendum)
Patient here with exertional rhabdo 2/2 new job. Initial CK trending slightly upward, Hospitalist consulted and recommended recheck after more fluids. CK continue to uptrend to 2275 after 4L fluids, will place on 150 cc/hr and admit. Cr does seem to have improved and K has corrected.  ADDENDUM: Discussed case with Dr. Para March who has reviewed case, labs. She feels that if pt is otherwise well appearing, has no ongoing pain/cramping, and promises to remain out of heat, no indication for admission at this time. He is tolerating PO, Cr has normalized. CK will naturally increase for 72 hr regardless of clinical course.  I had a long discussion with pt. He feels better and would prefer to manage from home. He understands he needs to stay out of the heat and avoid exercise/exertion, and will drink 8+ glasses of water daily and monitor his urine output and color. He will avoid any NSAIDs. Return precautions given.   Shaune Pollack, MD 11/18/22 Zettie Pho, MD 11/18/22 [phone]

## 2022-11-18 NOTE — Assessment & Plan Note (Signed)
Resolved with IV hydration in the ED    Latest Ref Rng & Units 11/18/2022    2:02 AM 11/17/2022    9:19 PM 11/17/2022    6:15 PM  BMP  Glucose 70 - 99 mg/dL 425  956  93   BUN 6 - 20 mg/dL 15  14  16    Creatinine 0.61 - 1.24 mg/dL 3.87  5.64  3.32   Sodium 135 - 145 mmol/L 137  134  133   Potassium 3.5 - 5.1 mmol/L 3.7  3.0  3.6   Chloride 98 - 111 mmol/L 106  102  99   CO2 22 - 32 mmol/L 24  22  22    Calcium 8.9 - 10.3 mg/dL 8.5  8.7  9.1

## 2022-11-18 NOTE — Assessment & Plan Note (Addendum)
Rising CK 1000-2000s in spite of IV hydration but this is to be expected in the first 2 to 3 days Patient reports no nausea, vomiting, headache.  States he feels much better Advised patient to take a longer break off of work and stay hydrated Patient can be safely discharged.  I discussed this with patient and he is agreeable.  He voices preference to going home, resting, staying away from work and staying hydrated Discussed with Dr. Erma Heritage

## 2022-11-18 NOTE — Consult Note (Signed)
Initial Consultation Note   Patient: Dylan Gay ZOX:[id number] DOB: Nov 02, 1995 PCP: Patient, No Pcp Per DOA: 11/17/2022 DOS: the patient was seen and examined on 11/18/2022 Primary service: No att. providers found  Referring physician: Dr Erma Heritage Reason for consult: rhabdomyolysis  Assessment/Plan: Assessment and Plan: * Rhabdomyolysis Rising CK 1000-2000s in spite of IV hydration but this is to be expected in the first 2 to 3 days Patient reports no nausea, vomiting, headache.  States he feels much better Advised patient to take a longer break off of work and stay hydrated Patient can be safely discharged.  I discussed this with patient and he is agreeable.  He voices preference to going home, resting, staying away from work and staying hydrated Discussed with Dr. Erma Heritage   AKI (acute kidney injury) [facility]) Resolved with IV hydration in the ED    Latest Ref Rng & Units 11/18/2022    2:02 AM 11/17/2022    9:19 PM 11/17/2022    6:15 PM  BMP  Glucose 70 - 99 mg/dL 811  914  93   BUN 6 - 20 mg/dL 15  14  16    Creatinine 0.61 - 1.24 mg/dL 7.82  9.56  2.13   Sodium 135 - 145 mmol/L 137  134  133   Potassium 3.5 - 5.1 mmol/L 3.7  3.0  3.6   Chloride 98 - 111 mmol/L 106  102  99   CO2 22 - 32 mmol/L 24  22  22    Calcium 8.9 - 10.3 mg/dL 8.5  8.7  9.1           TRH will sign off at present, please call us again when needed.  HPI: Dylan Gay is a 27 y.o. male with  no significant past medical history who presents to the ED for the concern of few days with severe muscle aches after working outside in the heat.  At his first visit 3 days prior, he was hydrated, felt improved and was discharged however he went back to work and symptoms recurred while outdoors today working on an [employer].  Patient states h he was hydrating with water and electrolyte and rested for 2 days but when he went back to work within several hours he started aching and cramping.  He has  been hydrated in the ED but due to concern for rising CK and spite of 4 L fluids, hospitalist consulted for admission.  Patient denies vomiting or malaise and his cramping has resolved.  Creatinine has normalized after several hours with hydration in the ED. Discussed with ED provider, Dr. Erma Heritage and recommend that patient can be discharged in spite of increased CK, given improvement in creatinine but with strict precautions for avoid doing any strenuous activities and n outdoor work.  He should try to stay well-hydrated..  Review of Systems: As mentioned in the history of present illness. All other systems reviewed and are negative. History reviewed. No pertinent past medical history. History reviewed. No pertinent surgical history. Social History:  reports that he has quit smoking. His smoking use included cigarettes. He has never used smokeless tobacco. He reports current alcohol use. He reports current drug use. Drugs: Marijuana and Cocaine.  No Known Allergies  History reviewed. No pertinent family history.  Prior to Admission medications   Medication Sig Start Date End Date Taking? Authorizing Provider  amoxicillin (AMOXIL) 500 MG capsule Take 1 capsule (500 mg total) by mouth 3 (three) times daily for 5 days. 11/15/22 11/20/22  Willy Eddy, MD  ibuprofen (ADVIL,MOTRIN) 400 MG tablet Take 1 tablet (400 mg total) by mouth every 6 (six) hours as needed for moderate pain. Patient not taking: Reported on 03/23/2020 08/26/15   Gayla Doss, MD    Physical Exam: Vitals:   11/18/22 0300 11/18/22 0330 11/18/22 0400 11/18/22 0430  BP: 106/63 (!) 109/51 110/62   Pulse: 74 77 73 74  Resp: 14 16 16    Temp:      TempSrc:      SpO2: 97% 96% 95% 96%  Weight:      Height:       Physical Exam Vitals and nursing note reviewed.  Constitutional:      General: He is not in acute distress. HENT:     Head: Normocephalic and atraumatic.  Cardiovascular:     Rate and Rhythm: Normal rate and  regular rhythm.     Heart sounds: Normal heart sounds.  Pulmonary:     Effort: Pulmonary effort is normal.     Breath sounds: Normal breath sounds.  Abdominal:     Palpations: Abdomen is soft.     Tenderness: There is no abdominal tenderness.  Neurological:     Mental Status: Mental status is at baseline.     Data Reviewed: Relevant notes from primary care and specialist visits, past discharge summaries as available in EHR, including Care Everywhere. Prior diagnostic testing as pertinent to current admission diagnoses Updated medications and problem lists for reconciliation ED course, including vitals, labs, imaging, treatment and response to treatment Triage notes, nursing and pharmacy notes and ED provider's notes Notable results as noted in HPI   Family Communication: none Primary team communication: Dr Erma Heritage Thank you very much for involving [place] in the care of your patient.  Author: Andris Baumann, MD 11/18/2022 4:49 AM  For on call review [email or web address].

## 2022-11-18 NOTE — Discharge Instructions (Signed)
Drink AT LEAST 6-8 glasses of water daily. Your urine should be CLEAR/very light yellow. If it is dark, drink more fluid.  NO HEAT EXPOSURE, EXERTION, OR EXERCISE FOR THE NEXT WEEK  YOU SHOULD NOT GO BACK TO THE JOB YOU HAVE BEEN DOING, AT ITS CURRENT INTENSITY. CONSIDER HALF DAYS OR CHANGE OF DUTIES.  DO NOT TAKE ADVIL, ASPIRIN, OR NSAID MEDICATIONS

## 2023-04-09 ENCOUNTER — Other Ambulatory Visit: Payer: Self-pay

## 2023-04-09 ENCOUNTER — Encounter (HOSPITAL_COMMUNITY): Payer: Self-pay

## 2023-04-09 ENCOUNTER — Emergency Department (HOSPITAL_COMMUNITY)
Admission: EM | Admit: 2023-04-09 | Discharge: 2023-04-10 | Disposition: A | Discharge status: Home / Self Care | Payer: Self-pay | Attending: Emergency Medicine | Admitting: Emergency Medicine

## 2023-04-09 DIAGNOSIS — F19959 Other psychoactive substance use, unspecified with psychoactive substance-induced psychotic disorder, unspecified: Secondary | ICD-10-CM

## 2023-04-09 DIAGNOSIS — F419 Anxiety disorder, unspecified: Secondary | ICD-10-CM | POA: Insufficient documentation

## 2023-04-09 DIAGNOSIS — D72829 Elevated white blood cell count, unspecified: Secondary | ICD-10-CM | POA: Insufficient documentation

## 2023-04-09 DIAGNOSIS — F1590 Other stimulant use, unspecified, uncomplicated: Secondary | ICD-10-CM | POA: Insufficient documentation

## 2023-04-09 DIAGNOSIS — F1290 Cannabis use, unspecified, uncomplicated: Secondary | ICD-10-CM | POA: Insufficient documentation

## 2023-04-09 DIAGNOSIS — R441 Visual hallucinations: Secondary | ICD-10-CM | POA: Insufficient documentation

## 2023-04-09 DIAGNOSIS — F1510 Other stimulant abuse, uncomplicated: Secondary | ICD-10-CM

## 2023-04-09 DIAGNOSIS — F19159 Other psychoactive substance abuse with psychoactive substance-induced psychotic disorder, unspecified: Secondary | ICD-10-CM | POA: Insufficient documentation

## 2023-04-09 DIAGNOSIS — Y900 Blood alcohol level of less than 20 mg/100 ml: Secondary | ICD-10-CM | POA: Insufficient documentation

## 2023-04-09 DIAGNOSIS — R000 Tachycardia, unspecified: Secondary | ICD-10-CM | POA: Insufficient documentation

## 2023-04-09 LAB — CBC WITH DIFFERENTIAL/PLATELET
Abs Immature Granulocytes: 0.09 10*3/uL — ABNORMAL HIGH (ref 0.00–0.07)
Basophils Absolute: 0.1 10*3/uL (ref 0.0–0.1)
Basophils Relative: 0 %
Eosinophils Absolute: 0.1 10*3/uL (ref 0.0–0.5)
Eosinophils Relative: 0 %
HCT: 47.2 % (ref 39.0–52.0)
Hemoglobin: 16.2 g/dL (ref 13.0–17.0)
Immature Granulocytes: 1 %
Lymphocytes Relative: 13 %
Lymphs Abs: 2.1 10*3/uL (ref 0.7–4.0)
MCH: 28.9 pg (ref 26.0–34.0)
MCHC: 34.3 g/dL (ref 30.0–36.0)
MCV: 84.1 fL (ref 80.0–100.0)
Monocytes Absolute: 1.8 10*3/uL — ABNORMAL HIGH (ref 0.1–1.0)
Monocytes Relative: 11 %
Neutro Abs: 12.4 10*3/uL — ABNORMAL HIGH (ref 1.7–7.7)
Neutrophils Relative %: 75 %
Platelets: 291 10*3/uL (ref 150–400)
RBC: 5.61 MIL/uL (ref 4.22–5.81)
RDW: 12.5 % (ref 11.5–15.5)
WBC: 16.5 10*3/uL — ABNORMAL HIGH (ref 4.0–10.5)
nRBC: 0 % (ref 0.0–0.2)

## 2023-04-09 LAB — BASIC METABOLIC PANEL
Anion gap: 13 (ref 5–15)
BUN: 23 mg/dL — ABNORMAL HIGH (ref 6–20)
CO2: 23 mmol/L (ref 22–32)
Calcium: 9.4 mg/dL (ref 8.9–10.3)
Chloride: 98 mmol/L (ref 98–111)
Creatinine, Ser: 1.19 mg/dL (ref 0.61–1.24)
GFR, Estimated: >60 mL/min (ref >60)
Glucose, Bld: 111 mg/dL — ABNORMAL HIGH (ref 70–99)
Potassium: 3 mmol/L — ABNORMAL LOW (ref 3.5–5.1)
Sodium: 134 mmol/L — ABNORMAL LOW (ref 135–145)

## 2023-04-09 LAB — ETHANOL: Alcohol, Ethyl (B): <10 mg/dL (ref <10)

## 2023-04-09 MED ORDER — LORAZEPAM 2 MG/ML IJ SOLN
1.0000 mg | Freq: Once | INTRAMUSCULAR | Status: AC
  Administered 2023-04-09: 1 mg via INTRAVENOUS
  Filled 2023-04-09: qty 1

## 2023-04-09 NOTE — ED Notes (Signed)
Pt unable to give urine specimen at this time 

## 2023-04-09 NOTE — ED Triage Notes (Addendum)
BIBA for visual hallucinations of snakes x 8 hours after first time smoking meth. Pt then smoked marijuana 4 hours ago to try to calm down, states it made things worse. Pt denies HI/SI 116 HR 162/90 131 cbg 97% room air

## 2023-04-09 NOTE — ED Provider Notes (Signed)
[place] EMERGENCY DEPARTMENT AT [facility] Provider Note   CSN: [id number] Arrival date & time: 04/09/23  2012     History  Chief Complaint  Patient presents with   Hallucinations    Dylan Gay is a 27 y.o. male with past medical history significant for cocaine-induced psychotic disorder, depression presents via EMS to the ED complaining of visual hallucinations of snakes for the past 8 hours.  Patient admits to using methamphetamine earlier in the day, which he has never used before.  He states approximately 4 hours prior to ED arrival, he smoked marijuana to try and calm down.  Patient feels this made his symptoms worse.  Patient currently feels like the room is "moving", he feels anxious with palpitations.  Denies chest pain, shortness of breath, syncope.  Denies SI, HI, other drug use.        Home Medications Prior to Admission medications   Medication Sig Start Date End Date Taking? Authorizing Provider  ibuprofen (ADVIL,MOTRIN) 400 MG tablet Take 1 tablet (400 mg total) by mouth every 6 (six) hours as needed for moderate pain. Patient not taking: Reported on 03/23/2020 08/26/15   Gayla Doss, MD      Allergies    Patient has no known allergies.    Review of Systems   Review of Systems  Respiratory:  Negative for shortness of breath.   Cardiovascular:  Positive for palpitations. Negative for chest pain.  Neurological:  Negative for syncope.  Psychiatric/Behavioral:  Positive for hallucinations. The patient is nervous/anxious.     Physical Exam Updated Vital Signs BP 123/80   Pulse 99   Temp 98.4 F (36.9 C)   Resp 20   SpO2 97%  Physical Exam Vitals and nursing note reviewed.  Constitutional:      General: He is not in acute distress.    Appearance: Normal appearance. He is not ill-appearing or diaphoretic.  Cardiovascular:     Rate and Rhythm: Regular rhythm. Tachycardia present.     Heart sounds: Normal heart sounds.   Pulmonary:     Effort: Pulmonary effort is normal.  Skin:    General: Skin is warm and dry.     Capillary Refill: Capillary refill takes less than 2 seconds.  Neurological:     Mental Status: He is alert. Mental status is at baseline.  Psychiatric:        Attention and Perception: He perceives visual hallucinations. He does not perceive auditory hallucinations.        Mood and Affect: Mood is anxious.        Speech: Speech is rapid and pressured.        Behavior: Behavior is hyperactive.     Comments: He feels like the room is "moving" currently.  Patient does not appear to be responding to internal stimuli.  He is switching between speaking in [place] and [place].  He is holding his phone up to his ear, but it does not appear he is on an active phone call.      ED Results / Procedures / Treatments   Labs (all labs ordered are listed, but only abnormal results are displayed) Labs Reviewed  CBC WITH DIFFERENTIAL/PLATELET - Abnormal; Notable for the following components:      Result Value   WBC 16.5 (*)    Neutro Abs 12.4 (*)    Monocytes Absolute 1.8 (*)    Abs Immature Granulocytes 0.09 (*)    All other components within normal limits  BASIC METABOLIC PANEL - Abnormal; Notable for the following components:   Sodium 134 (*)    Potassium 3.0 (*)    Glucose, Bld 111 (*)    BUN 23 (*)    All other components within normal limits  ETHANOL  RAPID URINE DRUG SCREEN, HOSP PERFORMED    EKG EKG Interpretation Date/Time:  Monday April 09 2023 20:54:19 EST Ventricular Rate:  118 PR Interval:  132 QRS Duration:  100 QT Interval:  304 QTC Calculation: 426 R Axis:   94  Text Interpretation: Sinus tachycardia Borderline right axis deviation ST elevation, consider anterolateral injury No acute changes No significant change since last tracing Confirmed by Derwood Kaplan [phone]) on 04/09/2023 9:01:15 PM  Radiology No results found.  Procedures Procedures    Medications  Ordered in ED Medications  LORazepam (ATIVAN) injection 1 mg (1 mg Intravenous Given 04/09/23 2107)  LORazepam (ATIVAN) injection 1 mg (1 mg Intravenous Given 04/09/23 2135)    ED Course/ Medical Decision Making/ A&P                                 Medical Decision Making Amount and/or Complexity of Data Reviewed Labs: ordered.   This patient presents to the ED with chief complaint(s) of meth and marijuana use, palpitations, anxiety with pertinent past medical history of cocaine use, psychosis.  The complaint involves an extensive differential diagnosis and also carries with it a high risk of complications and morbidity.    The differential diagnosis includes drug induced psychosis   The initial plan is to obtain labs, UDS  Initial Assessment:   Exam significant for patient who is very anxious and tachycardic.  He has rapid and pressured speech.  He feels like the room is "moving" but is not seeing any snakes or other people/creatures in the room.  He is switching between [place] and [place] asking for help.  Patient is holding his phone up to his ear like he is on a phone call, but it does not appear to be connected to anyone.  Skin is warm and dry with good perfusion.    Independent ECG/labs interpretation:  The following labs were independently interpreted:  CBC with leukocytosis, but this appears to be chronic for patient.  No anemia.  Metabolic panel with mild hypokalemia.  Renal function is normal.  Ethanol <10.   Treatment and Reassessment: Patient is tachycardic and appears very anxious.  He was given 1 mg IV Ativan with minimal improvement.  Patient was re-dosed with another 1 mg IV Ativan with significant improvement in symptoms.  Patient's HR improved and he is resting more comfortably.    Upon reassessment, patient is sleeping.  When RN in room is readjusting his BP cuff and pulse oximeter, patient begins jerking erratically in the bed and is moaning.  He does not full  rouse from sleep.  Patient will likely need further monitoring and will need to metabolize to freedom.    Disposition:   12:01 AM Care of patient transferred to Barrie Dunker, PA-C at the end of my shift as the patient will require reassessment once labs/imaging have resulted. Patient presentation, ED course, and plan of care discussed with review of all pertinent labs and imaging. Please see his/her note for further details regarding further ED course and disposition. Plan at time of handoff is allow patient to metabolize to freedom. This may be altered or completely changed at the discretion of  the oncoming team pending results of further workup.   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

## 2023-04-09 NOTE — ED Provider Notes (Incomplete)
  [place] EMERGENCY DEPARTMENT AT [facility] Provider Note   CSN: [id number] Arrival date & time: 04/09/23  2012     History {Add pertinent medical, surgical, social history, OB history to HPI:1} Chief Complaint  Patient presents with  . Hallucinations    Jeriah Anahun Tyjah Schuller is a 27 y.o. male with past medical history significant for cocaine-induced        Home Medications Prior to Admission medications   Medication Sig Start Date End Date Taking? Authorizing Provider  ibuprofen (ADVIL,MOTRIN) 400 MG tablet Take 1 tablet (400 mg total) by mouth every 6 (six) hours as needed for moderate pain. Patient not taking: Reported on 03/23/2020 08/26/15   Gayla Doss, MD      Allergies    Patient has no known allergies.    Review of Systems   Review of Systems  Physical Exam Updated Vital Signs BP 135/87   Pulse (!) 116   Temp 98.6 F (37 C) (Oral)   Resp 20   SpO2 100%  Physical Exam  ED Results / Procedures / Treatments   Labs (all labs ordered are listed, but only abnormal results are displayed) Labs Reviewed  RAPID URINE DRUG SCREEN, HOSP PERFORMED  CBC WITH DIFFERENTIAL/PLATELET  BASIC METABOLIC PANEL  ETHANOL    EKG EKG Interpretation Date/Time:  Monday April 09 2023 20:54:19 EST Ventricular Rate:  118 PR Interval:  132 QRS Duration:  100 QT Interval:  304 QTC Calculation: 426 R Axis:   94  Text Interpretation: Sinus tachycardia Borderline right axis deviation ST elevation, consider anterolateral injury No acute changes No significant change since last tracing Confirmed by Derwood Kaplan [phone]) on 04/09/2023 9:01:15 PM  Radiology No results found.  Procedures Procedures  {Document cardiac monitor, telemetry assessment procedure when appropriate:1}  Medications Ordered in ED Medications - No data to display  ED Course/ Medical Decision Making/ A&P   {   Click here for ABCD2, HEART and other calculatorsREFRESH  Note before signing :1}                              Medical Decision Making Amount and/or Complexity of Data Reviewed Labs: ordered.   ***  {Document critical care time when appropriate:1} {Document review of labs and clinical decision tools ie heart score, Chads2Vasc2 etc:1}  {Document your independent review of radiology images, and any outside records:1} {Document your discussion with family members, caretakers, and with consultants:1} {Document social determinants of health affecting pt's care:1} {Document your decision making why or why not admission, treatments were needed:1} Final Clinical Impression(s) / ED Diagnoses Final diagnoses:  None    Rx / DC Orders ED Discharge Orders     None

## 2023-04-10 ENCOUNTER — Ambulatory Visit (HOSPITAL_COMMUNITY)
Admission: EM | Admit: 2023-04-10 | Discharge: 2023-04-12 | Disposition: A | Discharge status: Other Acute Inpatient Hospital | Payer: No Payment, Other | Attending: Nurse Practitioner | Admitting: Nurse Practitioner

## 2023-04-10 DIAGNOSIS — F22 Delusional disorders: Secondary | ICD-10-CM | POA: Insufficient documentation

## 2023-04-10 DIAGNOSIS — R443 Hallucinations, unspecified: Secondary | ICD-10-CM

## 2023-04-10 DIAGNOSIS — R4589 Other symptoms and signs involving emotional state: Secondary | ICD-10-CM

## 2023-04-10 DIAGNOSIS — F1410 Cocaine abuse, uncomplicated: Secondary | ICD-10-CM | POA: Insufficient documentation

## 2023-04-10 DIAGNOSIS — F19951 Other psychoactive substance use, unspecified with psychoactive substance-induced psychotic disorder with hallucinations: Secondary | ICD-10-CM | POA: Insufficient documentation

## 2023-04-10 DIAGNOSIS — Z9151 Personal history of suicidal behavior: Secondary | ICD-10-CM | POA: Insufficient documentation

## 2023-04-10 DIAGNOSIS — F1290 Cannabis use, unspecified, uncomplicated: Secondary | ICD-10-CM | POA: Insufficient documentation

## 2023-04-10 DIAGNOSIS — R45851 Suicidal ideations: Secondary | ICD-10-CM | POA: Insufficient documentation

## 2023-04-10 DIAGNOSIS — F1910 Other psychoactive substance abuse, uncomplicated: Secondary | ICD-10-CM

## 2023-04-10 DIAGNOSIS — F411 Generalized anxiety disorder: Secondary | ICD-10-CM | POA: Insufficient documentation

## 2023-04-10 DIAGNOSIS — F1994 Other psychoactive substance use, unspecified with psychoactive substance-induced mood disorder: Secondary | ICD-10-CM

## 2023-04-10 LAB — POCT URINE DRUG SCREEN - MANUAL ENTRY (I-SCREEN)
POC Amphetamine UR: POSITIVE — AB
POC Buprenorphine (BUP): NOT DETECTED
POC Cocaine UR: NOT DETECTED
POC Marijuana UR: POSITIVE — AB
POC Methadone UR: NOT DETECTED
POC Methamphetamine UR: POSITIVE — AB
POC Morphine: NOT DETECTED
POC Oxazepam (BZO): POSITIVE — AB
POC Oxycodone UR: NOT DETECTED
POC Secobarbital (BAR): NOT DETECTED

## 2023-04-10 MED ORDER — HYDROXYZINE HCL 25 MG PO TABS
25.0000 mg | ORAL_TABLET | Freq: Three times a day (TID) | ORAL | Status: DC | PRN
  Administered 2023-04-10 – 2023-04-11 (×2): 25 mg via ORAL
  Filled 2023-04-10 (×2): qty 1

## 2023-04-10 MED ORDER — POTASSIUM CHLORIDE CRYS ER 20 MEQ PO TBCR
40.0000 meq | EXTENDED_RELEASE_TABLET | Freq: Once | ORAL | Status: AC
  Administered 2023-04-10: 40 meq via ORAL
  Filled 2023-04-10: qty 2

## 2023-04-10 MED ORDER — TRAZODONE HCL 50 MG PO TABS
50.0000 mg | ORAL_TABLET | Freq: Every evening | ORAL | Status: DC | PRN
  Administered 2023-04-10 – 2023-04-11 (×2): 50 mg via ORAL
  Filled 2023-04-10 (×2): qty 1

## 2023-04-10 MED ORDER — ALUM & MAG HYDROXIDE-SIMETH 200-200-20 MG/5ML PO SUSP: 30.0000 mL | ORAL | Status: DC | PRN

## 2023-04-10 MED ORDER — ACETAMINOPHEN 325 MG PO TABS: 650.0000 mg | ORAL_TABLET | Freq: Four times a day (QID) | ORAL | Status: DC | PRN

## 2023-04-10 MED ORDER — ZIPRASIDONE MESYLATE 20 MG IM SOLR
20.0000 mg | Freq: Once | INTRAMUSCULAR | Status: AC
  Administered 2023-04-10: 20 mg via INTRAMUSCULAR
  Filled 2023-04-10: qty 20

## 2023-04-10 MED ORDER — MAGNESIUM HYDROXIDE 400 MG/5ML PO SUSP: 30.0000 mL | Freq: Every day | ORAL | Status: DC | PRN

## 2023-04-10 NOTE — ED Provider Notes (Signed)
  Physical Exam  BP 123/80   Pulse 99   Temp 98.4 F (36.9 C)   Resp 20   SpO2 97%   Physical Exam  Procedures  Procedures  ED Course / MDM    Medical Decision Making Amount and/or Complexity of Data Reviewed Labs: ordered.  Risk Prescription drug management.   Patient care assumed at shift handoff from previous provider.  See her note for full details.  In short, 27 year old male with history significant for cocaine induced psychotic disorder, depression presented via EMS complaining of visual hallucinations of snakes for the past 8 hours.  The patient endorsed using methamphetamine earlier in the day which she had never used before.  He states that 4 hours prior to ED arrival he used marijuana to try to calm down after using the meth.  His symptoms began to worsen.  At time of my assumption of care patient has been administered Ativan to help with agitation with plans for reassessment after patient had time to metabolize.  Reevaluated patient at bedside he is calm, oriented, feels ready for discharge home.  Patient will be discharged with resources for outpatient substance abuse counseling.  Return precautions provided.       Darrick Grinder, PA-C 04/10/23 0300    Glendora Score, MD 04/10/23 [phone]

## 2023-04-10 NOTE — Progress Notes (Signed)
   04/10/23 2209  [facility] Triage Screening (Walk-ins at [facility] only)  How Did You Hear About [place]? Legal System  What Is the Reason for Your Visit/Call Today? Pt presents to [facility] voluntarily, accompanied by [employer] with complaint of paranoia and VH. Pt reports using crystal meth a few days ago, which he thought was cocaine. Pt reports that since then he has felt extremely paranoid, feeling like something spiritual is happening, cameras in his bedroom. Pt also reports seeing a woman in the passenger seat of the officer's car earlier tonight. Pt denies psychiatric history and is not established with outpatient resources at this time. Pt denies SI,HI,and substance/alcohol use.  How Long Has This Been Causing You Problems? <Week  Have You Recently Had Any Thoughts About Hurting Yourself? No  Are You Planning to Commit Suicide/Harm Yourself At This time? No  Have you Recently Had Thoughts About Hurting Someone Karolee Ohs? No  Are You Planning To Harm Someone At This Time? No  Physical Abuse Denies  Verbal Abuse Denies  Sexual Abuse Denies  Exploitation of patient/patient's resources Denies  Self-Neglect Denies  Are you currently experiencing any auditory, visual or other hallucinations? Yes  Please explain the hallucinations you are currently experiencing: saw woman in passenger seat of officer car, snakes in his bedroom yesterday  Have You Used Any Alcohol or Drugs in the Past 24 Hours? No  Do you have any current medical co-morbidities that require immediate attention? No  Clinician description of patient physical appearance/behavior: pt is cooperative, tearful, casually dressed  What Do You Feel Would Help You the Most Today? Treatment for Depression or other mood problem  If access to [facility] was not available, would you have sought care in the Emergency Department? Yes  Determination of Need Urgent (48 hours)  Options For Referral Other: Comment;BH Urgent Care;Outpatient Therapy;Medication Management   Determination of Need filed? Yes

## 2023-04-10 NOTE — ED Notes (Addendum)
Pt is paranoid, disorganized,voices VH, states " I see someone else here in the room",  pacing and yelling " I do not feel safe".. Reassured pt of safety, meds given per order. Will continue to monitor for safety.

## 2023-04-10 NOTE — Discharge Instructions (Addendum)
If you develop any life threatening symptoms please return to the emergency department.

## 2023-04-10 NOTE — ED Notes (Signed)
Pt not compliant with getting vitals. He can not urinate either. Pt is jittery and loud. Given water so he can urinate.

## 2023-04-10 NOTE — ED Provider Notes (Signed)
[facility]gent [facility] Continuous Assessment Admission H&P  Date: 04/10/23 Patient Name: Dylan Gay Dakhari Chichester MRN: [id number] Chief Complaint: " I was at a party and I did two lines of what I thought was cocaine".   Diagnoses:  Final diagnoses:  Hallucination  Anxious appearance  Anxiety state  Substance abuse (HCC)  Paranoia (HCC)    HPI: Christian A. 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  Patient was seen face-to-face by this provider and chart reviewed.  Per chart review, patient presented to [facility] yesterday 12/23 with similar complaints of visual hallucinations and paranoia in the setting of methamphetamine use.  On evaluation, patient is alert, oriented x2, and cooperative. Speech is clear, rapid and pressured. Pt appears casually dressed. Eye contact is poor. Mood is anxious, affect is congruent with mood. Thought process is disorganized and thought content is tangential. Pt denies SI/HI/AH. He endorses VH and paranoia. There is no objective indication that the patient is responding to internal stimuli. No delusions elicited during this assessment.    Presents as very restless with tangential speech, but with a lot of redirection, he is able to report in bits and pieces "I was at a party unsure if it was Monday or Sunday, I snorted 2 lines of what I thought was cocaine, and later that afternoon I went home and everything was normal and I could only sleep for 2 hours, and then I started seeing snakes and I was tripping and I started panicking and I went to a friend's and I smoked some more blunt and the next thing I'm sitting down and having some hallucinations and seeing snakes and I got paranoid and I felt I was really tripping and I went to the ED and I felt better and I went home  and the hallucinations started again and I don't feel safe".  Patient endorses illicit substance use after he was discharged from the ED yesterday. He lives with his roommate. Unable to continue evaluation as patient is restless and talkative with tangential disorganized speech which is incoherent at times.  Discussed recommendation for admission to the continuous observation unit for safety monitoring/med management overnight and reevaluate in the a.m. for SI/HI/AVH and paranoia.  Patient provided with opportunity for questions.  He verbalizes his understanding and is in agreement.   Total Time spent with patient: 30 minutes  Musculoskeletal  Strength & Muscle Tone: within normal limits Gait & Station: normal Patient leans: N/A  Psychiatric Specialty Exam  Presentation General Appearance:  Casual  Eye Contact: Poor  Speech: Pressured  Speech Volume: Normal  Handedness: Right   Mood and Affect  Mood: Anxious  Affect: Congruent   Thought Process  Thought Processes: Disorganized  Descriptions of Associations:Tangential  Orientation:Partial  Thought Content:Tangential    Hallucinations:Hallucinations: Visual Description of Visual Hallucinations: Pt reports seeing snakes  Ideas of Reference:Paranoia  Suicidal Thoughts:Suicidal Thoughts: No  Homicidal Thoughts:Homicidal Thoughts: No   Sensorium  Memory: Immediate Poor  Judgment: Poor  Insight: Poor   Executive Functions  Concentration: Poor  Attention Span: Poor  Recall: Poor  Fund of Knowledge: Poor  Language: Poor   Psychomotor Activity  Psychomotor Activity: Psychomotor Activity: Restlessness   Assets  Assets: Communication Skills; Desire for Improvement   Sleep  Sleep: Sleep: Poor   Nutritional Assessment (For OBS and FBC admissions only)  Has the patient had a weight loss or gain of 10 pounds or more in the last 3 months?: No Has the patient had a decrease in  food intake/or appetite?: No Does the patient have dental problems?: No Does the patient have eating habits or behaviors that may be indicators of an eating disorder including binging or inducing vomiting?: No Has the patient recently lost weight without trying?: 0 Has the patient been eating poorly because of a decreased appetite?: 0 Malnutrition Screening Tool Score: 0    Physical Exam Constitutional:      General: He is not in acute distress.    Appearance: He is not diaphoretic.  HENT:     Head: Normocephalic.     Nose: No congestion.  Eyes:     General:        Right eye: No discharge.        Left eye: No discharge.  Cardiovascular:     Rate and Rhythm: Tachycardia present.  Pulmonary:     Effort: No respiratory distress.  Chest:     Chest wall: No tenderness.  Neurological:     General: No focal deficit present.     Mental Status: He is alert.  Psychiatric:        Attention and Perception: He perceives auditory hallucinations.        Mood and Affect: Mood is anxious.        Speech: Speech is rapid and pressured.        Behavior: Behavior is agitated. Behavior is cooperative.        Thought Content: Thought content is paranoid.    Review of Systems  Constitutional:  Negative for chills, diaphoresis and fever.  HENT:  Negative for congestion.   Eyes:  Negative for discharge.  Respiratory:  Negative for cough, shortness of breath and wheezing.   Cardiovascular:  Negative for chest pain and palpitations.  Gastrointestinal:  Negative for diarrhea, nausea and vomiting.  Neurological:  Negative for dizziness, seizures and headaches.  Psychiatric/Behavioral:  Positive for substance abuse. The patient is nervous/anxious.     Blood pressure 117/85, pulse (!) 107, temperature 98.3 F (36.8 C), temperature source Oral, resp. rate 18, SpO2 97%. There is no height or weight on file to calculate BMI.  Past Psychiatric History: See H & P   Is the patient at risk to self? Yes   Has the patient been a risk to self in the past 6 months? Yes .    Has the patient been a risk to self within the distant past? Yes   Is the patient a risk to others? No   Has the patient been a risk to others in the past 6 months? No   Has the patient been a risk to others within the distant past? No   Past Medical History: See Chart  Family History: N/A  Social History: N/A  Last Labs:  Admission on 04/10/2023  Component Date Value Ref Range Status   POC Amphetamine UR 04/10/2023 Positive (A)  NONE DETECTED (Cut Off Level 1000 ng/mL) Final   POC Secobarbital (BAR) 04/10/2023 None Detected  NONE DETECTED (Cut Off Level 300 ng/mL) Final   POC Buprenorphine (BUP) 04/10/2023 None Detected  NONE DETECTED (Cut Off Level 10 ng/mL) Final   POC Oxazepam (BZO) 04/10/2023 Positive (A)  NONE DETECTED (Cut Off Level 300 ng/mL) Final   POC Cocaine UR 04/10/2023 None Detected  NONE DETECTED (Cut Off Level 300 ng/mL) Final   POC Methamphetamine UR  04/10/2023 Positive (A)  NONE DETECTED (Cut Off Level 1000 ng/mL) Final   POC Morphine 04/10/2023 None Detected  NONE DETECTED (Cut Off Level 300 ng/mL) Final   POC Methadone UR 04/10/2023 None Detected  NONE DETECTED (Cut Off Level 300 ng/mL) Final   POC Oxycodone UR 04/10/2023 None Detected  NONE DETECTED (Cut Off Level 100 ng/mL) Final   POC Marijuana UR 04/10/2023 Positive (A)  NONE DETECTED (Cut Off Level 50 ng/mL) Final  Admission on 04/09/2023, Discharged on 04/10/2023  Component Date Value Ref Range Status   WBC 04/09/2023 16.5 (H)  4.0 - 10.5 K/uL Final   RBC 04/09/2023 5.61  4.22 - 5.81 MIL/uL Final   Hemoglobin 04/09/2023 16.2  13.0 - 17.0 g/dL Final   HCT 96/07/5407 47.2  39.0 - 52.0 % Final   MCV 04/09/2023 84.1  80.0 - 100.0 fL Final   MCH 04/09/2023 28.9  26.0 - 34.0 pg Final   MCHC 04/09/2023 34.3  30.0 - 36.0 g/dL Final   RDW 81/19/1478 12.5  11.5 - 15.5 % Final   Platelets 04/09/2023 291  150 - 400 K/uL Final   nRBC 04/09/2023 0.0   0.0 - 0.2 % Final   Neutrophils Relative % 04/09/2023 75  % Final   Neutro Abs 04/09/2023 12.4 (H)  1.7 - 7.7 K/uL Final   Lymphocytes Relative 04/09/2023 13  % Final   Lymphs Abs 04/09/2023 2.1  0.7 - 4.0 K/uL Final   Monocytes Relative 04/09/2023 11  % Final   Monocytes Absolute 04/09/2023 1.8 (H)  0.1 - 1.0 K/uL Final   Eosinophils Relative 04/09/2023 0  % Final   Eosinophils Absolute 04/09/2023 0.1  0.0 - 0.5 K/uL Final   Basophils Relative 04/09/2023 0  % Final   Basophils Absolute 04/09/2023 0.1  0.0 - 0.1 K/uL Final   Immature Granulocytes 04/09/2023 1  % Final   Abs Immature Granulocytes 04/09/2023 0.09 (H)  0.00 - 0.07 K/uL Final   Performed at [facility], [address], [place], [place] [address]   Sodium 04/09/2023 134 (L)  135 - 145 mmol/L Final   Potassium 04/09/2023 3.0 (L)  3.5 - 5.1 mmol/L Final   Chloride 04/09/2023 98  98 - 111 mmol/L Final   CO2 04/09/2023 23  22 - 32 mmol/L Final   Glucose, Bld 04/09/2023 111 (H)  70 - 99 mg/dL Final   Glucose reference range applies only to samples taken after fasting for at least 8 hours.   BUN 04/09/2023 23 (H)  6 - 20 mg/dL Final   Creatinine, Ser 04/09/2023 1.19  0.61 - 1.24 mg/dL Final   Calcium 13/11/6576 9.4  8.9 - 10.3 mg/dL Final   GFR, Estimated 04/09/2023 >60  >60 mL/min Final   Comment: (NOTE) Calculated using the CKD-EPI Creatinine Equation (2021)    Anion gap 04/09/2023 13  5 - 15 Final   Performed at [facility], [address], [place], [place] [address]   Alcohol, Ethyl (B) 04/09/2023 <10  <10 mg/dL Final   Comment: (NOTE) Lowest detectable limit for serum alcohol is 10 mg/dL.  For medical purposes only. Performed at [facility], [address], [place], [place] [address]   Admission on 11/17/2022, Discharged on 11/18/2022  Component Date Value Ref Range Status   WBC 11/17/2022 15.9 (H)  4.0 - 10.5 K/uL Final   RBC 11/17/2022 5.33  4.22 - 5.81  MIL/uL Final   Hemoglobin 11/17/2022 14.7  13.0 - 17.0  g/dL Final   HCT 78/46/9629 43.4  39.0 - 52.0 % Final   MCV 11/17/2022 81.4  80.0 - 100.0 fL Final   MCH 11/17/2022 27.6  26.0 - 34.0 pg Final   MCHC 11/17/2022 33.9  30.0 - 36.0 g/dL Final   RDW 52/84/1324 12.4  11.5 - 15.5 % Final   Platelets 11/17/2022 299  150 - 400 K/uL Final   nRBC 11/17/2022 0.0  0.0 - 0.2 % Final   Performed at [facility], [address] [place], [place] [address]   SARS Coronavirus 2 by RT PCR 11/17/2022 NEGATIVE  NEGATIVE Final   Comment: (NOTE) SARS-CoV-2 target nucleic acids are NOT DETECTED.  The SARS-CoV-2 RNA is generally detectable in upper respiratory specimens during the acute phase of infection. The lowest concentration of SARS-CoV-2 viral copies this assay can detect is 138 copies/mL. A negative result does not preclude SARS-Cov-2 infection and should not be used as the sole basis for treatment or other patient management decisions. A negative result may occur with  improper specimen collection/handling, submission of specimen other than nasopharyngeal swab, presence of viral mutation(s) within the areas targeted by this assay, and inadequate number of viral copies(<138 copies/mL). A negative result must be combined with clinical observations, patient history, and epidemiological information. The expected result is Negative.  Fact Sheet for Patients:  BloggerCourse.com  Fact Sheet for Healthcare Providers:  SeriousBroker.it  This test is no                          t yet approved or cleared by the [place] FDA and  has been authorized for detection and/or diagnosis of SARS-CoV-2 by FDA under an Emergency Use Authorization (EUA). This EUA will remain  in effect (meaning this test can be used) for the duration of the COVID-19 declaration under Section 564(b)(1) of the Act, 21 U.S.C.section 360bbb-3(b)(1), unless the  authorization is terminated  or revoked sooner.       Influenza A by PCR 11/17/2022 NEGATIVE  NEGATIVE Final   Influenza B by PCR 11/17/2022 NEGATIVE  NEGATIVE Final   Comment: (NOTE) The Xpert Xpress SARS-CoV-2/FLU/RSV plus assay is intended as an aid in the diagnosis of influenza from Nasopharyngeal swab specimens and should not be used as a sole basis for treatment. Nasal washings and aspirates are unacceptable for Xpert Xpress SARS-CoV-2/FLU/RSV testing.  Fact Sheet for Patients: BloggerCourse.com  Fact Sheet for Healthcare Providers: SeriousBroker.it  This test is not yet approved or cleared by the [place] FDA and has been authorized for detection and/or diagnosis of SARS-CoV-2 by FDA under an Emergency Use Authorization (EUA). This EUA will remain in effect (meaning this test can be used) for the duration of the COVID-19 declaration under Section 564(b)(1) of the Act, 21 U.S.C. section 360bbb-3(b)(1), unless the authorization is terminated or revoked.  Performed at [facility], [address] [place], [place] [address]    Color, Urine 11/17/2022 YELLOW (A)  YELLOW Final   APPearance 11/17/2022 HAZY (A)  CLEAR Final   Specific Gravity, Urine 11/17/2022 1.010  1.005 - 1.030 Final   pH 11/17/2022 5.0  5.0 - 8.0 Final   Glucose, UA 11/17/2022 NEGATIVE  NEGATIVE mg/dL Final   Hgb urine dipstick 11/17/2022 MODERATE (A)  NEGATIVE Final   Bilirubin Urine 11/17/2022 NEGATIVE  NEGATIVE Final   Ketones, ur 11/17/2022 NEGATIVE  NEGATIVE mg/dL Final   Protein, ur 64/40/3474 30 (A)  NEGATIVE mg/dL Final  Nitrite 11/17/2022 NEGATIVE  NEGATIVE Final   Leukocytes,Ua 11/17/2022 NEGATIVE  NEGATIVE Final   RBC / HPF 11/17/2022 0-5  0 - 5 RBC/hpf Final   WBC, UA 11/17/2022 0-5  0 - 5 WBC/hpf Final   Bacteria, UA 11/17/2022 RARE (A)  NONE SEEN Final   Squamous Epithelial / HPF 11/17/2022 0-5  0 - 5 /HPF Final   Mucus  11/17/2022 PRESENT   Final   Hyaline Casts, UA 11/17/2022 PRESENT   Final   Performed at [facility], [address] [place], [place] [address]   Sodium 11/17/2022 133 (L)  135 - 145 mmol/L Final   Potassium 11/17/2022 3.6  3.5 - 5.1 mmol/L Final   Chloride 11/17/2022 99  98 - 111 mmol/L Final   CO2 11/17/2022 22  22 - 32 mmol/L Final   Glucose, Bld 11/17/2022 93  70 - 99 mg/dL Final   Glucose reference range applies only to samples taken after fasting for at least 8 hours.   BUN 11/17/2022 16  6 - 20 mg/dL Final   Creatinine, Ser 11/17/2022 1.48 (H)  0.61 - 1.24 mg/dL Final   Calcium 60/45/4098 9.1  8.9 - 10.3 mg/dL Final   GFR, Estimated 11/17/2022 >60  >60 mL/min Final   Comment: (NOTE) Calculated using the CKD-EPI Creatinine Equation (2021)    Anion gap 11/17/2022 12  5 - 15 Final   Performed at [facility], [address] [place], [place] [address]   Total CK 11/17/2022 1,181 (H)  49 - 397 U/L Final   Performed at [facility], [address] [place], [place] [address]   Magnesium 11/17/2022 2.1  1.7 - 2.4 mg/dL Final   Performed at [facility], [address] [place], [place] [address]   Sodium 11/17/2022 134 (L)  135 - 145 mmol/L Final   Potassium 11/17/2022 3.0 (L)  3.5 - 5.1 mmol/L Final   Chloride 11/17/2022 102  98 - 111 mmol/L Final   CO2 11/17/2022 22  22 - 32 mmol/L Final   Glucose, Bld 11/17/2022 155 (H)  70 - 99 mg/dL Final   Glucose reference range applies only to samples taken after fasting for at least 8 hours.   BUN 11/17/2022 14  6 - 20 mg/dL Final   Creatinine, Ser 11/17/2022 1.28 (H)  0.61 - 1.24 mg/dL Final   Calcium 86/57/8469 8.7 (L)  8.9 - 10.3 mg/dL Final   GFR, Estimated 11/17/2022 >60  >60 mL/min Final   Comment: (NOTE) Calculated using the CKD-EPI Creatinine Equation (2021)    Anion gap 11/17/2022 10  5 - 15 Final   Performed at [facility], [address] [place], [place] [address]   Total  CK 11/17/2022 1,499 (H)  49 - 397 U/L Final   Performed at [facility], [address] [place], [place] [address]   Sodium 11/18/2022 137  135 - 145 mmol/L Final   Potassium 11/18/2022 3.7  3.5 - 5.1 mmol/L Final   Chloride 11/18/2022 106  98 - 111 mmol/L Final   CO2 11/18/2022 24  22 - 32 mmol/L Final   Glucose, Bld 11/18/2022 107 (H)  70 - 99 mg/dL Final   Glucose reference range applies only to samples taken after fasting for at least 8 hours.   BUN 11/18/2022 15  6 - 20 mg/dL Final   Creatinine, Ser 11/18/2022 1.04  0.61 - 1.24 mg/dL Final   Calcium 44/04/270 8.5 (L)  8.9 - 10.3 mg/dL  Final   GFR, Estimated 11/18/2022 >60  >60 mL/min Final   Comment: (NOTE) Calculated using the CKD-EPI Creatinine Equation (2021)    Anion gap 11/18/2022 7  5 - 15 Final   Performed at [facility] [address] [place], [place] [address]   Total CK 11/18/2022 2,275 (H)  49 - 397 U/L Final   Performed at [facility], [address] [place], [place] [address]  Admission on 11/15/2022, Discharged on 11/15/2022  Component Date Value Ref Range Status   WBC 11/15/2022 16.4 (H)  4.0 - 10.5 K/uL Final   RBC 11/15/2022 5.87 (H)  4.22 - 5.81 MIL/uL Final   Hemoglobin 11/15/2022 16.2  13.0 - 17.0 g/dL Final   HCT 84/16/6063 48.6  39.0 - 52.0 % Final   MCV 11/15/2022 82.8  80.0 - 100.0 fL Final   MCH 11/15/2022 27.6  26.0 - 34.0 pg Final   MCHC 11/15/2022 33.3  30.0 - 36.0 g/dL Final   RDW 01/60/1093 12.3  11.5 - 15.5 % Final   Platelets 11/15/2022 325  150 - 400 K/uL Final   nRBC 11/15/2022 0.0  0.0 - 0.2 % Final   Neutrophils Relative % 11/15/2022 70  % Final   Neutro Abs 11/15/2022 11.4 (H)  1.7 - 7.7 K/uL Final   Lymphocytes Relative 11/15/2022 19  % Final   Lymphs Abs 11/15/2022 3.1  0.7 - 4.0 K/uL Final   Monocytes Relative 11/15/2022 10  % Final   Monocytes Absolute 11/15/2022 1.6 (H)  0.1 - 1.0 K/uL Final   Eosinophils Relative 11/15/2022 0  % Final    Eosinophils Absolute 11/15/2022 0.1  0.0 - 0.5 K/uL Final   Basophils Relative 11/15/2022 0  % Final   Basophils Absolute 11/15/2022 0.1  0.0 - 0.1 K/uL Final   Immature Granulocytes 11/15/2022 1  % Final   Abs Immature Granulocytes 11/15/2022 0.17 (H)  0.00 - 0.07 K/uL Final   Performed at [facility], [address] [place], [place] [address]   Sodium 11/15/2022 134 (L)  135 - 145 mmol/L Final   Potassium 11/15/2022 3.5  3.5 - 5.1 mmol/L Final   Chloride 11/15/2022 99  98 - 111 mmol/L Final   CO2 11/15/2022 22  22 - 32 mmol/L Final   Glucose, Bld 11/15/2022 136 (H)  70 - 99 mg/dL Final   Glucose reference range applies only to samples taken after fasting for at least 8 hours.   BUN 11/15/2022 17  6 - 20 mg/dL Final   Creatinine, Ser 11/15/2022 1.81 (H)  0.61 - 1.24 mg/dL Final   Calcium 32/20/2542 9.7  8.9 - 10.3 mg/dL Final   Total Protein 70/62/3762 8.1  6.5 - 8.1 g/dL Final   Albumin 83/15/1761 4.7  3.5 - 5.0 g/dL Final   AST 60/73/7106 46 (H)  15 - 41 U/L Final   ALT 11/15/2022 43  0 - 44 U/L Final   Alkaline Phosphatase 11/15/2022 85  38 - 126 U/L Final   Total Bilirubin 11/15/2022 0.8  0.3 - 1.2 mg/dL Final   GFR, Estimated 11/15/2022 52 (L)  >60 mL/min Final   Comment: (NOTE) Calculated using the CKD-EPI Creatinine Equation (2021)    Anion gap 11/15/2022 13  5 - 15 Final   Performed at [facility], [address] [place], [place] [address]   SARS Coronavirus 2 by RT PCR 11/15/2022 NEGATIVE  NEGATIVE Final   Comment: (NOTE) SARS-CoV-2 target nucleic acids are NOT DETECTED.  The SARS-CoV-2 RNA is generally detectable in upper and lower respiratory specimens during the acute phase of infection. The lowest concentration of SARS-CoV-2 viral copies this assay can detect is 250 copies / mL. A negative result does not preclude SARS-CoV-2 infection and should not be used as the sole basis for treatment or other patient management decisions.  A negative  result may occur with improper specimen collection / handling, submission of specimen other than nasopharyngeal swab, presence of viral mutation(s) within the areas targeted by this assay, and inadequate number of viral copies (<250 copies / mL). A negative result must be combined with clinical observations, patient history, and epidemiological information.  Fact Sheet for Patients:   RoadLapTop.co.za  Fact Sheet for Healthcare Providers: http://kim-miller.com/  This test is not yet approved or                           cleared by the [place] FDA and has been authorized for detection and/or diagnosis of SARS-CoV-2 by FDA under an Emergency Use Authorization (EUA).  This EUA will remain in effect (meaning this test can be used) for the duration of the COVID-19 declaration under Section 564(b)(1) of the Act, 21 U.S.C. section 360bbb-3(b)(1), unless the authorization is terminated or revoked sooner.  Performed at [facility], [address] [place], [place] [address]    Glucose-Capillary 11/15/2022 133 (H)  70 - 99 mg/dL Final   Glucose reference range applies only to samples taken after fasting for at least 8 hours.   Total CK 11/15/2022 565 (H)  49 - 397 U/L Final   Performed at [facility], [address] [place], [place] [address]   Tricyclic, Ur Screen 11/15/2022 NONE DETECTED  NONE DETECTED Final   Amphetamines, Ur Screen 11/15/2022 NONE DETECTED  NONE DETECTED Final   MDMA (Ecstasy)Ur Screen 11/15/2022 NONE DETECTED  NONE DETECTED Final   Cocaine Metabolite,Ur [place] 11/15/2022 NONE DETECTED  NONE DETECTED Final   Opiate, Ur Screen 11/15/2022 NONE DETECTED  NONE DETECTED Final   Phencyclidine (PCP) Ur S 11/15/2022 NONE DETECTED  NONE DETECTED Final   Cannabinoid 50 Ng, Ur [place] 11/15/2022 POSITIVE (A)  NONE DETECTED Final   Barbiturates, Ur Screen 11/15/2022 NONE DETECTED  NONE DETECTED Final   Benzodiazepine,  Ur Scrn 11/15/2022 NONE DETECTED  NONE DETECTED Final   Methadone Scn, Ur 11/15/2022 NONE DETECTED  NONE DETECTED Final   Comment: (NOTE) Tricyclics + metabolites, urine    Cutoff 1000 ng/mL Amphetamines + metabolites, urine  Cutoff 1000 ng/mL MDMA (Ecstasy), urine              Cutoff 500 ng/mL Cocaine Metabolite, urine          Cutoff 300 ng/mL Opiate + metabolites, urine        Cutoff 300 ng/mL Phencyclidine (PCP), urine         Cutoff 25 ng/mL Cannabinoid, urine                 Cutoff 50 ng/mL Barbiturates + metabolites, urine  Cutoff 200 ng/mL Benzodiazepine, urine              Cutoff 200 ng/mL Methadone, urine                   Cutoff 300 ng/mL  The urine drug screen provides only a preliminary, unconfirmed analytical test result and should not be used for non-medical purposes. Clinical consideration and professional judgment should be  applied to any positive drug screen result due to possible interfering substances. A more specific alternate chemical method must be used in order to obtain a confirmed analytical result. Gas chromatography / mass spectrometry (GC/MS) is the preferred confirm                          atory method. Performed at [facility], [address] [place], [place] [address]    Color, Urine 11/15/2022 YELLOW (A)  YELLOW Final   APPearance 11/15/2022 HAZY (A)  CLEAR Final   Specific Gravity, Urine 11/15/2022 1.025  1.005 - 1.030 Final   pH 11/15/2022 5.0  5.0 - 8.0 Final   Glucose, UA 11/15/2022 NEGATIVE  NEGATIVE mg/dL Final   Hgb urine dipstick 11/15/2022 NEGATIVE  NEGATIVE Final   Bilirubin Urine 11/15/2022 NEGATIVE  NEGATIVE Final   Ketones, ur 11/15/2022 NEGATIVE  NEGATIVE mg/dL Final   Protein, ur 64/33/2951 100 (A)  NEGATIVE mg/dL Final   Nitrite 88/41/6606 NEGATIVE  NEGATIVE Final   Leukocytes,Ua 11/15/2022 NEGATIVE  NEGATIVE Final   RBC / HPF 11/15/2022 0-5  0 - 5 RBC/hpf Final   WBC, UA 11/15/2022 0-5  0 - 5 WBC/hpf Final    Bacteria, UA 11/15/2022 FEW (A)  NONE SEEN Final   Squamous Epithelial / HPF 11/15/2022 0-5  0 - 5 /HPF Final   Mucus 11/15/2022 PRESENT   Final   Hyaline Casts, UA 11/15/2022 PRESENT   Final   Performed at [facility], [address] [place], [place] [address]   Total CK 11/15/2022 569 (H)  49 - 397 U/L Final   Performed at [facility], [address] [place], [place] [address]   Sodium 11/15/2022 137  135 - 145 mmol/L Final   Potassium 11/15/2022 4.0  3.5 - 5.1 mmol/L Final   Chloride 11/15/2022 106  98 - 111 mmol/L Final   CO2 11/15/2022 23  22 - 32 mmol/L Final   Glucose, Bld 11/15/2022 103 (H)  70 - 99 mg/dL Final   Glucose reference range applies only to samples taken after fasting for at least 8 hours.   BUN 11/15/2022 13  6 - 20 mg/dL Final   Creatinine, Ser 11/15/2022 1.11  0.61 - 1.24 mg/dL Final   Calcium 35/57/3220 8.4 (L)  8.9 - 10.3 mg/dL Final   GFR, Estimated 11/15/2022 >60  >60 mL/min Final   Comment: (NOTE) Calculated using the CKD-EPI Creatinine Equation (2021)    Anion gap 11/15/2022 8  5 - 15 Final   Performed at [facility], [address] [place], [place] [address]    Allergies: Patient has no known allergies.  Medications:  Facility Ordered Medications  Medication   acetaminophen (TYLENOL) tablet 650 mg   alum & mag hydroxide-simeth (MAALOX/MYLANTA) 200-200-20 MG/5ML suspension 30 mL   magnesium hydroxide (MILK OF MAGNESIA) suspension 30 mL   hydrOXYzine (ATARAX) tablet 25 mg   traZODone (DESYREL) tablet 50 mg   ziprasidone (GEODON) injection 20 mg   potassium chloride SA (KLOR-CON M) CR tablet 40 mEq   PTA Medications  Medication Sig   ibuprofen (ADVIL,MOTRIN) 400 MG tablet Take 1 tablet (400 mg total) by mouth every 6 (six) hours as needed for moderate pain. (Patient not taking: Reported on 03/23/2020)      Medical Decision Making  Recommend admission to the continuous observation unit overnight for safety  monitoring and reevaluate in the a.m. for SI/HI/AVH and paranoia. Unsafe discharging tonight.  Lab Orders         POCT Urine Drug Screen - (I-Screen)     EKG  I have reviewed EKG, meds and labs performed at [facility] 12/23.  CMP-Potassium abnormal, will replete with 40 MeQ of Klor-con once during tonight, CBC with diff abnormal, patient will follow up with outpatient provider.     Agitation Medications ordered -Geodon 20 mg IM once for agitation -Zyprexa 5 mg PO q8h, prn  agitation and -Ativan 1 mg PO, once for severe anxiety, agitation  Other Prns -Tylenol 650 mg p.o. every 6 hours as needed pain -Maalox 30 mL p.o. every 4 hours as needed indigestion -Atarax 25 mg p.o. 3 times daily as needed anxiety -MOM 30 mL p.o. daily as needed constipation -Trazodone 50 mg p.o. nightly as needed insomnia   Recommendations  Based on my evaluation the patient does not appear to have an emergency medical condition.  Recommend admission to the continuous observation unit overnight for safety monitoring and reevaluate in the a.m. for SI/HI/AVH and paranoia.  Mancel Bale, NP 04/10/23  11:08 PM

## 2023-04-11 MED ORDER — OLANZAPINE 5 MG PO TBDP
5.0000 mg | ORAL_TABLET | Freq: Three times a day (TID) | ORAL | Status: DC | PRN
  Administered 2023-04-11 (×2): 5 mg via ORAL
  Filled 2023-04-11 (×2): qty 1

## 2023-04-11 MED ORDER — ZIPRASIDONE MESYLATE 20 MG IM SOLR
20.0000 mg | Freq: Two times a day (BID) | INTRAMUSCULAR | Status: DC | PRN
  Administered 2023-04-11: 20 mg via INTRAMUSCULAR
  Filled 2023-04-11: qty 20

## 2023-04-11 MED ORDER — LORAZEPAM 1 MG PO TABS: 1.0000 mg | ORAL_TABLET | Freq: Once | ORAL | Status: DC

## 2023-04-11 MED ORDER — LORAZEPAM 2 MG/ML IJ SOLN
1.0000 mg | Freq: Once | INTRAMUSCULAR | Status: AC
  Administered 2023-04-11: 1 mg via INTRAMUSCULAR
  Filled 2023-04-11: qty 1

## 2023-04-11 MED ORDER — OLANZAPINE 5 MG PO TBDP
5.0000 mg | ORAL_TABLET | Freq: Every day | ORAL | Status: DC
  Administered 2023-04-11: 5 mg via ORAL
  Filled 2023-04-11: qty 1

## 2023-04-11 NOTE — Progress Notes (Signed)
LCSW Progress Note  [id number]   Dylan Gay [name]  04/11/2023  11:01 AM  Description:   Inpatient Psychiatric Referral  Patient was recommended inpatient per Tivis Ringer NP. There are no available beds at [facility], per [facility] RN. Patient was referred to the following out of network facilities:   [facility] Provider Address Phone Fax  [facility] [address] [place] [address] [phone]  [facility] Patient Placement [facility], [place] [phone]  [facility] [address] [place] [address] [phone]  [facility] [address], [place] [address] [phone]  [facility] [address] [place], [place] [address] [phone]  [facility] [address] [place] [address] [phone]  [facility] [address] [place] [address] [phone]  [facility] [address], [place] [address] [phone]  [facility] [address] [place] [address] [phone]  [facility] [address], [place] [address] [phone]  [facility] [place] [phone]  [facility] [address], [place] [address] [phone]  [facility] [address], [place] [address] [phone]  [facility] [address] [place] [address] [phone]  [facility] EFAX [address] [place], [place] [phone]  [facility] [address],  [place] [address] [phone]  [facility] [address] [place] [address] [phone]  [facility] [address], [place] [address] [phone]  [facility] [address], [place] [address] [phone]  [facility] EFAX [place] [phone]       Situation ongoing, CSW to continue following and update chart as more information becomes available.      [place] Latrell Potempa, MSW, LCSW  04/11/2023 11:01 AM

## 2023-04-11 NOTE — ED Notes (Signed)
The patient is noticeably irritable. Reports he wants to "jump out of his skin". See MAR for medication administration. No acute distress noted. Environment is secured. Will continue to monitor for safety.

## 2023-04-11 NOTE — ED Notes (Signed)
Pt is sleeping in recliner bed. RR even and unlabored. No noted distress. Will continue to monitor for safety.
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
## 2023-04-11 NOTE — ED Notes (Signed)
The patient reports increasing agitation and irritability, feeling "pent-up". See MAR for medication administration. No acute distress noted. Environment is secured. Will continue to monitor for safety.

## 2023-04-11 NOTE — ED Notes (Signed)
Patient is sleeping. Respirations equal and unlabored, skin warm and dry. No change in assessment or acuity. Routine safety checks conducted according to facility protocol. Will continue to monitor for safety.   

## 2023-04-11 NOTE — ED Provider Notes (Signed)
Behavioral Health Progress Note  Date and Time: 04/11/2023 5:53 PM Name: Dylan Gay MRN:  [id number]  Subjective: " I am feeling a little better.  I am still feeling anxious about being chased and followed"  Diagnosis:  Final diagnoses:  Hallucination  Anxiety state  Substance abuse (HCC)  Paranoia (HCC)  Suicidal ideation  Dylan Gay, 27 y.o., male patient seen face to face by this provider, consulted with Dr. Enedina Finner; and chart reviewed on 04/11/23.  On evaluation Dylan Gay continues to report auditory and visual hallucinations.  Patient states that he continues to see snakes but only the heads and he can see spray coming from cracks in the walls.  Patient states the voices say you are "fucked up". Patient also continues to notice paranoia that he is being chased and followed.  Patient denies homicidal ideation.  Patient endorses suicidal ideations with intent to bash his head in.  Patient states he has been diagnosed with anxiety.  Patient states he does not have a mental health provider that manages his diagnosis or prescriptions.  States he was given a 14-day supply of anxiety medication in the past he is not sure what medications he was given. Patient has given permission to speak with his mother Dylan Gay.    Collateral: Patient's mother is in lobby because patient continues to call her endorsing suicide.  Mother is very worried about Dylan Gay that is why she came here.  Patient states to mother that he wants to end his life and that others want to end his life as well.  Mom states that she spoke with Dylan Gay ...Gay stated that he was going to pay [name] back by ending his life.    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  Patient states that he lives with a Gay.  Patient believes the Gay is against him although he gave the Gay a place to live.  Patient  states he has no legal issues only a speeding ticket he needs to take care of.  Patient endorses the use of marijuana which he smokes often and cocaine which he uses 2-3 times a month.     During evaluation Dylan Gay is sitting in no acute distress.  He is alert, oriented x 4, calm, cooperative and attentive.  His mood is anxious with congruent affect.  He has normal speech, and behavior.  Objectively there is evidence of psychosis.  Patient is able to converse coherently, with pre-occupation of fearfulness that he is being based in follow-up.  He also endorses suicidal/self-harm.  He denies homicidal ideation..  Patient answered question appropriately.      Total Time spent with patient: 30 minutes  Past Psychiatric History: Patient states he has diagnosis of anxiety Past Medical History: Patient states he has had a seizure from heat exhaustion Family History: No pertinent family history reported Family Psychiatric  History: No pertinent family psychiatric history reported Social History: Patient is employed as an Engineer, site.  Maintains a home and lives with a Gay.  Patient uses marijuana and cocaine.  Additional Social History:    Pain Medications: See MAR Prescriptions: See MAR Over the Counter: See MAR History of alcohol / drug use?: Yes Withdrawal Symptoms: None                    Sleep: Fair  Appetite:  Good  Current Medications:  Current Facility-Administered Medications  Medication Dose Route  Frequency Provider Last Rate Last Admin   acetaminophen (TYLENOL) tablet 650 mg  650 mg Oral Q6H PRN Onuoha, Chinwendu V, NP       alum & mag hydroxide-simeth (MAALOX/MYLANTA) 200-200-20 MG/5ML suspension 30 mL  30 mL Oral Q4H PRN Onuoha, Chinwendu V, NP       hydrOXYzine (ATARAX) tablet 25 mg  25 mg Oral TID PRN Onuoha, Chinwendu V, NP   25 mg at 04/10/23 2310   magnesium hydroxide (MILK OF MAGNESIA) suspension 30 mL  30 mL Oral Daily PRN Onuoha,  Chinwendu V, NP       OLANZapine zydis (ZYPREXA) disintegrating tablet 5 mg  5 mg Oral Q8H PRN Onuoha, Chinwendu V, NP   5 mg at 04/11/23 1613   OLANZapine zydis (ZYPREXA) disintegrating tablet 5 mg  5 mg Oral QHS Jaelyne Deeg, NP       traZODone (DESYREL) tablet 50 mg  50 mg Oral QHS PRN Onuoha, Chinwendu V, NP   50 mg at 04/10/23 2310   ziprasidone (GEODON) injection 20 mg  20 mg Intramuscular Q12H PRN Onuoha, Chinwendu V, NP   20 mg at 04/11/23 1345   No current outpatient medications on file.    Labs  Lab Results:  Admission on 04/10/2023  Component Date Value Ref Range Status   POC Amphetamine UR 04/10/2023 Positive (A)  NONE DETECTED (Cut Off Level 1000 ng/mL) Final   POC Secobarbital (BAR) 04/10/2023 None Detected  NONE DETECTED (Cut Off Level 300 ng/mL) Final   POC Buprenorphine (BUP) 04/10/2023 None Detected  NONE DETECTED (Cut Off Level 10 ng/mL) Final   POC Oxazepam (BZO) 04/10/2023 Positive (A)  NONE DETECTED (Cut Off Level 300 ng/mL) Final   POC Cocaine UR 04/10/2023 None Detected  NONE DETECTED (Cut Off Level 300 ng/mL) Final   POC Methamphetamine UR 04/10/2023 Positive (A)  NONE DETECTED (Cut Off Level 1000 ng/mL) Final   POC Morphine 04/10/2023 None Detected  NONE DETECTED (Cut Off Level 300 ng/mL) Final   POC Methadone UR 04/10/2023 None Detected  NONE DETECTED (Cut Off Level 300 ng/mL) Final   POC Oxycodone UR 04/10/2023 None Detected  NONE DETECTED (Cut Off Level 100 ng/mL) Final   POC Marijuana UR 04/10/2023 Positive (A)  NONE DETECTED (Cut Off Level 50 ng/mL) Final  Admission on 04/09/2023, Discharged on 04/10/2023  Component Date Value Ref Range Status   WBC 04/09/2023 16.5 (H)  4.0 - 10.5 K/uL Final   RBC 04/09/2023 5.61  4.22 - 5.81 MIL/uL Final   Hemoglobin 04/09/2023 16.2  13.0 - 17.0 g/dL Final   HCT 74/25/9563 47.2  39.0 - 52.0 % Final   MCV 04/09/2023 84.1  80.0 - 100.0 fL Final   MCH 04/09/2023 28.9  26.0 - 34.0 pg Final   MCHC 04/09/2023 34.3  30.0  - 36.0 g/dL Final   RDW 87/56/4332 12.5  11.5 - 15.5 % Final   Platelets 04/09/2023 291  150 - 400 K/uL Final   nRBC 04/09/2023 0.0  0.0 - 0.2 % Final   Neutrophils Relative % 04/09/2023 75  % Final   Neutro Abs 04/09/2023 12.4 (H)  1.7 - 7.7 K/uL Final   Lymphocytes Relative 04/09/2023 13  % Final   Lymphs Abs 04/09/2023 2.1  0.7 - 4.0 K/uL Final   Monocytes Relative 04/09/2023 11  % Final   Monocytes Absolute 04/09/2023 1.8 (H)  0.1 - 1.0 K/uL Final   Eosinophils Relative 04/09/2023 0  % Final   Eosinophils Absolute 04/09/2023 0.1  0.0 - 0.5 K/uL Final   Basophils Relative 04/09/2023 0  % Final   Basophils Absolute 04/09/2023 0.1  0.0 - 0.1 K/uL Final   Immature Granulocytes 04/09/2023 1  % Final   Abs Immature Granulocytes 04/09/2023 0.09 (H)  0.00 - 0.07 K/uL Final   Performed at [facility], [address], [place], [place] [address]   Sodium 04/09/2023 134 (L)  135 - 145 mmol/L Final   Potassium 04/09/2023 3.0 (L)  3.5 - 5.1 mmol/L Final   Chloride 04/09/2023 98  98 - 111 mmol/L Final   CO2 04/09/2023 23  22 - 32 mmol/L Final   Glucose, Bld 04/09/2023 111 (H)  70 - 99 mg/dL Final   Glucose reference range applies only to samples taken after fasting for at least 8 hours.   BUN 04/09/2023 23 (H)  6 - 20 mg/dL Final   Creatinine, Ser 04/09/2023 1.19  0.61 - 1.24 mg/dL Final   Calcium 28/41/3244 9.4  8.9 - 10.3 mg/dL Final   GFR, Estimated 04/09/2023 >60  >60 mL/min Final   Comment: (NOTE) Calculated using the CKD-EPI Creatinine Equation (2021)    Anion gap 04/09/2023 13  5 - 15 Final   Performed at [facility], [address], [place], [place] [address]   Alcohol, Ethyl (B) 04/09/2023 <10  <10 mg/dL Final   Comment: (NOTE) Lowest detectable limit for serum alcohol is 10 mg/dL.  For medical purposes only. Performed at [facility], [address], [place], [place] [address]   Admission on 11/17/2022, Discharged on  11/18/2022  Component Date Value Ref Range Status   WBC 11/17/2022 15.9 (H)  4.0 - 10.5 K/uL Final   RBC 11/17/2022 5.33  4.22 - 5.81 MIL/uL Final   Hemoglobin 11/17/2022 14.7  13.0 - 17.0 g/dL Final   HCT 44/06/4740 43.4  39.0 - 52.0 % Final   MCV 11/17/2022 81.4  80.0 - 100.0 fL Final   MCH 11/17/2022 27.6  26.0 - 34.0 pg Final   MCHC 11/17/2022 33.9  30.0 - 36.0 g/dL Final   RDW 59/56/3875 12.4  11.5 - 15.5 % Final   Platelets 11/17/2022 299  150 - 400 K/uL Final   nRBC 11/17/2022 0.0  0.0 - 0.2 % Final   Performed at [facility], [address] [place], [place] [address]   SARS Coronavirus 2 by RT PCR 11/17/2022 NEGATIVE  NEGATIVE Final   Comment: (NOTE) SARS-CoV-2 target nucleic acids are NOT DETECTED.  The SARS-CoV-2 RNA is generally detectable in upper respiratory specimens during the acute phase of infection. The lowest concentration of SARS-CoV-2 Dylan Gay copies this assay can detect is 138 copies/mL. A negative result does not preclude SARS-Cov-2 infection and should not be used as the sole basis for treatment or other patient management decisions. A negative result may occur with  improper specimen collection/handling, submission of specimen other than nasopharyngeal swab, presence of Dylan Gay mutation(s) within the areas targeted by this assay, and inadequate number of Dylan Gay copies(<138 copies/mL). A negative result must be combined with clinical observations, patient history, and epidemiological information. The expected result is Negative.  Fact Sheet for Patients:  BloggerCourse.com  Fact Sheet for Healthcare Providers:  SeriousBroker.it  This test is no                          t yet approved or cleared by the [place] and  has been authorized for  detection and/or diagnosis of SARS-CoV-2 by FDA under an Emergency Use Authorization (EUA). This EUA will remain  in effect (meaning this test can be  used) for the duration of the COVID-19 declaration under Section 564(b)(1) of the Act, 21 U.S.C.section 360bbb-3(b)(1), unless the authorization is terminated  or revoked sooner.       Influenza A by PCR 11/17/2022 NEGATIVE  NEGATIVE Final   Influenza B by PCR 11/17/2022 NEGATIVE  NEGATIVE Final   Comment: (NOTE) The Xpert Xpress SARS-CoV-2/FLU/RSV plus assay is intended as an aid in the diagnosis of influenza from Nasopharyngeal swab specimens and should not be used as a sole basis for treatment. Nasal washings and aspirates are unacceptable for Xpert Xpress SARS-CoV-2/FLU/RSV testing.  Fact Sheet for Patients: BloggerCourse.com  Fact Sheet for Healthcare Providers: SeriousBroker.it  This test is not yet approved or cleared by the [place] FDA and has been authorized for detection and/or diagnosis of SARS-CoV-2 by FDA under an Emergency Use Authorization (EUA). This EUA will remain in effect (meaning this test can be used) for the duration of the COVID-19 declaration under Section 564(b)(1) of the Act, 21 U.S.C. section 360bbb-3(b)(1), unless the authorization is terminated or revoked.  Performed at [facility], [address] [place], [place] [address]    Color, Urine 11/17/2022 YELLOW (A)  YELLOW Final   APPearance 11/17/2022 HAZY (A)  CLEAR Final   Specific Gravity, Urine 11/17/2022 1.010  1.005 - 1.030 Final   pH 11/17/2022 5.0  5.0 - 8.0 Final   Glucose, UA 11/17/2022 NEGATIVE  NEGATIVE mg/dL Final   Hgb urine dipstick 11/17/2022 MODERATE (A)  NEGATIVE Final   Bilirubin Urine 11/17/2022 NEGATIVE  NEGATIVE Final   Ketones, ur 11/17/2022 NEGATIVE  NEGATIVE mg/dL Final   Protein, ur 98/02/9146 30 (A)  NEGATIVE mg/dL Final   Nitrite 82/95/6213 NEGATIVE  NEGATIVE Final   Leukocytes,Ua 11/17/2022 NEGATIVE  NEGATIVE Final   RBC / HPF 11/17/2022 0-5  0 - 5 RBC/hpf Final   WBC, UA 11/17/2022 0-5  0 - 5 WBC/hpf  Final   Bacteria, UA 11/17/2022 RARE (A)  NONE SEEN Final   Squamous Epithelial / HPF 11/17/2022 0-5  0 - 5 /HPF Final   Mucus 11/17/2022 PRESENT   Final   Hyaline Casts, UA 11/17/2022 PRESENT   Final   Performed at [facility], [address] [place], [place] [address]   Sodium 11/17/2022 133 (L)  135 - 145 mmol/L Final   Potassium 11/17/2022 3.6  3.5 - 5.1 mmol/L Final   Chloride 11/17/2022 99  98 - 111 mmol/L Final   CO2 11/17/2022 22  22 - 32 mmol/L Final   Glucose, Bld 11/17/2022 93  70 - 99 mg/dL Final   Glucose reference range applies only to samples taken after fasting for at least 8 hours.   BUN 11/17/2022 16  6 - 20 mg/dL Final   Creatinine, Ser 11/17/2022 1.48 (H)  0.61 - 1.24 mg/dL Final   Calcium 84/69/6295 9.1  8.9 - 10.3 mg/dL Final   GFR, Estimated 11/17/2022 >60  >60 mL/min Final   Comment: (NOTE) Calculated using the CKD-EPI Creatinine Equation (2021)    Anion gap 11/17/2022 12  5 - 15 Final   Performed at [facility], [address] [place], [place] [address]   Total CK 11/17/2022 1,181 (H)  49 - 397 U/L Final   Performed at [facility], [address] [place], [place] [address]   Magnesium 11/17/2022 2.1  1.7 -  2.4 mg/dL Final   Performed at [facility], [address] [place], [place] [address]   Sodium 11/17/2022 134 (L)  135 - 145 mmol/L Final   Potassium 11/17/2022 3.0 (L)  3.5 - 5.1 mmol/L Final   Chloride 11/17/2022 102  98 - 111 mmol/L Final   CO2 11/17/2022 22  22 - 32 mmol/L Final   Glucose, Bld 11/17/2022 155 (H)  70 - 99 mg/dL Final   Glucose reference range applies only to samples taken after fasting for at least 8 hours.   BUN 11/17/2022 14  6 - 20 mg/dL Final   Creatinine, Ser 11/17/2022 1.28 (H)  0.61 - 1.24 mg/dL Final   Calcium 60/45/4098 8.7 (L)  8.9 - 10.3 mg/dL Final   GFR, Estimated 11/17/2022 >60  >60 mL/min Final   Comment: (NOTE) Calculated using the CKD-EPI Creatinine Equation (2021)     Anion gap 11/17/2022 10  5 - 15 Final   Performed at [facility], [address] [place], [place] [address]   Total CK 11/17/2022 1,499 (H)  49 - 397 U/L Final   Performed at [facility], [address] [place], [place] [address]   Sodium 11/18/2022 137  135 - 145 mmol/L Final   Potassium 11/18/2022 3.7  3.5 - 5.1 mmol/L Final   Chloride 11/18/2022 106  98 - 111 mmol/L Final   CO2 11/18/2022 24  22 - 32 mmol/L Final   Glucose, Bld 11/18/2022 107 (H)  70 - 99 mg/dL Final   Glucose reference range applies only to samples taken after fasting for at least 8 hours.   BUN 11/18/2022 15  6 - 20 mg/dL Final   Creatinine, Ser 11/18/2022 1.04  0.61 - 1.24 mg/dL Final   Calcium 62/13/0865 8.5 (L)  8.9 - 10.3 mg/dL Final   GFR, Estimated 11/18/2022 >60  >60 mL/min Final   Comment: (NOTE) Calculated using the CKD-EPI Creatinine Equation (2021)    Anion gap 11/18/2022 7  5 - 15 Final   Performed at [facility], [address] [place], [place] [address]   Total CK 11/18/2022 2,275 (H)  49 - 397 U/L Final   Performed at [facility], [address] [place], [place] [address]  Admission on 11/15/2022, Discharged on 11/15/2022  Component Date Value Ref Range Status   WBC 11/15/2022 16.4 (H)  4.0 - 10.5 K/uL Final   RBC 11/15/2022 5.87 (H)  4.22 - 5.81 MIL/uL Final   Hemoglobin 11/15/2022 16.2  13.0 - 17.0 g/dL Final   HCT 84/13/2440 48.6  39.0 - 52.0 % Final   MCV 11/15/2022 82.8  80.0 - 100.0 fL Final   MCH 11/15/2022 27.6  26.0 - 34.0 pg Final   MCHC 11/15/2022 33.3  30.0 - 36.0 g/dL Final   RDW 02/11/2535 12.3  11.5 - 15.5 % Final   Platelets 11/15/2022 325  150 - 400 K/uL Final   nRBC 11/15/2022 0.0  0.0 - 0.2 % Final   Neutrophils Relative % 11/15/2022 70  % Final   Neutro Abs 11/15/2022 11.4 (H)  1.7 - 7.7 K/uL Final   Lymphocytes Relative 11/15/2022 19  % Final   Lymphs Abs 11/15/2022 3.1  0.7 - 4.0 K/uL Final   Monocytes Relative 11/15/2022 10   % Final   Monocytes Absolute 11/15/2022 1.6 (H)  0.1 - 1.0 K/uL Final   Eosinophils Relative 11/15/2022 0  % Final   Eosinophils Absolute 11/15/2022 0.1  0.0 - 0.5 K/uL  Final   Basophils Relative 11/15/2022 0  % Final   Basophils Absolute 11/15/2022 0.1  0.0 - 0.1 K/uL Final   Immature Granulocytes 11/15/2022 1  % Final   Abs Immature Granulocytes 11/15/2022 0.17 (H)  0.00 - 0.07 K/uL Final   Performed at [facility], [address] [place], [place] [address]   Sodium 11/15/2022 134 (L)  135 - 145 mmol/L Final   Potassium 11/15/2022 3.5  3.5 - 5.1 mmol/L Final   Chloride 11/15/2022 99  98 - 111 mmol/L Final   CO2 11/15/2022 22  22 - 32 mmol/L Final   Glucose, Bld 11/15/2022 136 (H)  70 - 99 mg/dL Final   Glucose reference range applies only to samples taken after fasting for at least 8 hours.   BUN 11/15/2022 17  6 - 20 mg/dL Final   Creatinine, Ser 11/15/2022 1.81 (H)  0.61 - 1.24 mg/dL Final   Calcium 40/98/1191 9.7  8.9 - 10.3 mg/dL Final   Total Protein 47/82/9562 8.1  6.5 - 8.1 g/dL Final   Albumin 13/11/6576 4.7  3.5 - 5.0 g/dL Final   AST 46/96/2952 46 (H)  15 - 41 U/L Final   ALT 11/15/2022 43  0 - 44 U/L Final   Alkaline Phosphatase 11/15/2022 85  38 - 126 U/L Final   Total Bilirubin 11/15/2022 0.8  0.3 - 1.2 mg/dL Final   GFR, Estimated 11/15/2022 52 (L)  >60 mL/min Final   Comment: (NOTE) Calculated using the CKD-EPI Creatinine Equation (2021)    Anion gap 11/15/2022 13  5 - 15 Final   Performed at [facility], [address] [place], [place] [address]   SARS Coronavirus 2 by RT PCR 11/15/2022 NEGATIVE  NEGATIVE Final   Comment: (NOTE) SARS-CoV-2 target nucleic acids are NOT DETECTED.  The SARS-CoV-2 RNA is generally detectable in upper and lower respiratory specimens during the acute phase of infection. The lowest concentration of SARS-CoV-2 Dylan Gay copies this assay can detect is 250 copies / mL. A negative result does not preclude SARS-CoV-2  infection and should not be used as the sole basis for treatment or other patient management decisions.  A negative result may occur with improper specimen collection / handling, submission of specimen other than nasopharyngeal swab, presence of Dylan Gay mutation(s) within the areas targeted by this assay, and inadequate number of Dylan Gay copies (<250 copies / mL). A negative result must be combined with clinical observations, patient history, and epidemiological information.  Fact Sheet for Patients:   RoadLapTop.co.za  Fact Sheet for Healthcare Providers: http://kim-miller.com/  This test is not yet approved or                           cleared by the [place] FDA and has been authorized for detection and/or diagnosis of SARS-CoV-2 by FDA under an Emergency Use Authorization (EUA).  This EUA will remain in effect (meaning this test can be used) for the duration of the COVID-19 declaration under Section 564(b)(1) of the Act, 21 U.S.C. section 360bbb-3(b)(1), unless the authorization is terminated or revoked sooner.  Performed at [facility], [address] [place], [place] [address]    Glucose-Capillary 11/15/2022 133 (H)  70 - 99 mg/dL Final   Glucose reference range applies only to samples taken after fasting for at least 8 hours.   Total CK 11/15/2022 565 (H)  49 - 397 U/L Final   Performed at [facility], [address] [place], [place] [address]   Tricyclic, Ur Screen 11/15/2022 NONE DETECTED  NONE DETECTED Final   Amphetamines, Ur Screen 11/15/2022 NONE DETECTED  NONE DETECTED Final   MDMA (Ecstasy)Ur Screen 11/15/2022 NONE DETECTED  NONE DETECTED Final   Cocaine Metabolite,Ur [place] 11/15/2022 NONE DETECTED  NONE DETECTED Final   Opiate, Ur Screen 11/15/2022 NONE DETECTED  NONE DETECTED Final   Phencyclidine (PCP) Ur S 11/15/2022 NONE DETECTED  NONE DETECTED Final   Cannabinoid 50 Ng, Ur [place] 11/15/2022  POSITIVE (A)  NONE DETECTED Final   Barbiturates, Ur Screen 11/15/2022 NONE DETECTED  NONE DETECTED Final   Benzodiazepine, Ur Scrn 11/15/2022 NONE DETECTED  NONE DETECTED Final   Methadone Scn, Ur 11/15/2022 NONE DETECTED  NONE DETECTED Final   Comment: (NOTE) Tricyclics + metabolites, urine    Cutoff 1000 ng/mL Amphetamines + metabolites, urine  Cutoff 1000 ng/mL MDMA (Ecstasy), urine              Cutoff 500 ng/mL Cocaine Metabolite, urine          Cutoff 300 ng/mL Opiate + metabolites, urine        Cutoff 300 ng/mL Phencyclidine (PCP), urine         Cutoff 25 ng/mL Cannabinoid, urine                 Cutoff 50 ng/mL Barbiturates + metabolites, urine  Cutoff 200 ng/mL Benzodiazepine, urine              Cutoff 200 ng/mL Methadone, urine                   Cutoff 300 ng/mL  The urine drug screen provides only a preliminary, unconfirmed analytical test result and should not be used for non-medical purposes. Clinical consideration and professional judgment should be applied to any positive drug screen result due to possible interfering substances. A more specific alternate chemical method must be used in order to obtain a confirmed analytical result. Gas chromatography / mass spectrometry (GC/MS) is the preferred confirm                          atory method. Performed at [facility], [address] [place], [place] [address]    Color, Urine 11/15/2022 YELLOW (A)  YELLOW Final   APPearance 11/15/2022 HAZY (A)  CLEAR Final   Specific Gravity, Urine 11/15/2022 1.025  1.005 - 1.030 Final   pH 11/15/2022 5.0  5.0 - 8.0 Final   Glucose, UA 11/15/2022 NEGATIVE  NEGATIVE mg/dL Final   Hgb urine dipstick 11/15/2022 NEGATIVE  NEGATIVE Final   Bilirubin Urine 11/15/2022 NEGATIVE  NEGATIVE Final   Ketones, ur 11/15/2022 NEGATIVE  NEGATIVE mg/dL Final   Protein, ur 21/30/8657 100 (A)  NEGATIVE mg/dL Final   Nitrite 84/69/6295 NEGATIVE  NEGATIVE Final   Leukocytes,Ua 11/15/2022  NEGATIVE  NEGATIVE Final   RBC / HPF 11/15/2022 0-5  0 - 5 RBC/hpf Final   WBC, UA 11/15/2022 0-5  0 - 5 WBC/hpf Final   Bacteria, UA 11/15/2022 FEW (A)  NONE SEEN Final   Squamous Epithelial / HPF 11/15/2022 0-5  0 - 5 /HPF Final   Mucus 11/15/2022 PRESENT   Final   Hyaline Casts, UA 11/15/2022 PRESENT   Final   Performed at [facility], [address] [place], [place] [address]   Total CK 11/15/2022 569 (H)  49 - 397 U/L Final   Performed at [facility], [address] [place], [place] [address]   Sodium 11/15/2022 137  135 - 145 mmol/L Final   Potassium 11/15/2022 4.0  3.5 - 5.1 mmol/L Final   Chloride 11/15/2022 106  98 - 111 mmol/L Final   CO2 11/15/2022 23  22 - 32 mmol/L Final   Glucose, Bld 11/15/2022 103 (H)  70 - 99 mg/dL Final   Glucose reference range applies only to samples taken after fasting for at least 8 hours.   BUN 11/15/2022 13  6 - 20 mg/dL Final   Creatinine, Ser 11/15/2022 1.11  0.61 - 1.24 mg/dL Final   Calcium 60/45/4098 8.4 (L)  8.9 - 10.3 mg/dL Final   GFR, Estimated 11/15/2022 >60  >60 mL/min Final   Comment: (NOTE) Calculated using the CKD-EPI Creatinine Equation (2021)    Anion gap 11/15/2022 8  5 - 15 Final   Performed at [facility] [address] [place], [place] [address]    Blood Alcohol level:  Lab Results  Component Value Date   [facility] <10 04/09/2023   ETH <10 03/23/2020    Metabolic Disorder Labs: Lab Results  Component Value Date   HGBA1C 5.5 03/25/2020   MPG 111.15 03/25/2020   No results found for: "PROLACTIN" Lab Results  Component Value Date   CHOL 150 03/25/2020   TRIG 176 (H) 03/25/2020   HDL 39 (L) 03/25/2020   CHOLHDL 3.8 03/25/2020   VLDL 35 03/25/2020   LDLCALC 76 03/25/2020    Therapeutic Lab Levels: No results found for: "LITHIUM" No results found for: "VALPROATE" No results found for: "CBMZ"  Physical Findings   AIMS    Flowsheet Row Admission (Discharged) from 03/24/2020  in [facility] INPATIENT BEHAVIORAL MEDICINE  AIMS Total Score 0      AUDIT    Flowsheet Row Admission (Discharged) from 03/24/2020 in [facility] INPATIENT BEHAVIORAL MEDICINE  Alcohol Use Disorder Identification Test Final Score (AUDIT) 5      Flowsheet Row ED from 04/10/2023 in [facility] ED from 04/09/2023 in [facility] Emergency Department at [facility] ED from 11/17/2022 in [facility] Emergency Department at [facility]  C-SSRS RISK CATEGORY No Risk No Risk No Risk        Musculoskeletal  Strength & Muscle Tone: within normal limits Gait & Station: normal Patient leans: N/A  Psychiatric Specialty Exam  Presentation  General Appearance:  Appropriate for Environment; Disheveled  Eye Contact: Fair  Speech: Pressured  Speech Volume: Normal  Handedness: Right   Mood and Affect  Mood: Anxious; Depressed; Hopeless  Affect: Congruent   Thought Process  Thought Processes: Disorganized  Descriptions of Associations:Loose  Orientation:Full (Time, Place and Person)  Thought Content:Paranoid Ideation  Diagnosis of Schizophrenia or Schizoaffective disorder in past: No  Duration of Psychotic Symptoms: Less than six months   Hallucinations:Hallucinations: Auditory; Visual Description of Auditory Hallucinations: Voices say you are "fucked up" Description of Visual Hallucinations: Can see black snake heads and spray coming from cracks  Ideas of Reference:Paranoia  Suicidal Thoughts:Suicidal Thoughts: Yes, Active (wants to bash head in) SI Active Intent and/or Plan: With Intent  Homicidal Thoughts:Homicidal Thoughts: No   Sensorium  Memory: Immediate Good; Recent Good  Judgment: Fair  Insight: Fair   Chartered certified accountant: Fair  Attention Span: Fair  Recall: [employer] of Knowledge: Fair  Language: Fair   Psychomotor Activity  Psychomotor Activity: Psychomotor Activity:  Normal   Assets  Assets: Manufacturing systems engineer; Desire for Improvement; Vocational/Educational  Sleep  Sleep: Sleep: Poor Number of Hours of Sleep: 4   Nutritional Assessment (For OBS and FBC admissions only) Has the patient had a weight loss or gain of 10 pounds or more in the last 3 months?: No Has the patient had a decrease in food intake/or appetite?: No Does the patient have dental problems?: No Does the patient have eating habits or behaviors that may be indicators of an eating disorder including binging or inducing vomiting?: No Has the patient recently lost weight without trying?: 0 Has the patient been eating poorly because of a decreased appetite?: 0 Malnutrition Screening Tool Score: 0    Physical Exam  Physical Exam HENT:     Head: Normocephalic.     Nose: Nose normal.     Mouth/Throat:     Mouth: Mucous membranes are moist.  Eyes:     Extraocular Movements: Extraocular movements intact.  Cardiovascular:     Rate and Rhythm: Tachycardia present.  Pulmonary:     Effort: Pulmonary effort is normal.  Musculoskeletal:        General: Normal range of motion.     Cervical back: Normal range of motion.  Skin:    General: Skin is dry.  Neurological:     Mental Status: He is alert.    Review of Systems  Constitutional: Negative.   HENT: Negative.    Eyes: Negative.   Respiratory: Negative.    Cardiovascular: Negative.   Gastrointestinal: Negative.   Genitourinary: Negative.   Musculoskeletal: Negative.   Skin: Negative.   Neurological: Negative.   Psychiatric/Behavioral:  Positive for hallucinations and substance abuse. The patient is nervous/anxious.    Blood pressure (!) 134/96, pulse (!) 102, temperature 98.8 F (37.1 C), temperature source Oral, resp. rate 20, SpO2 94%. There is no height or weight on file to calculate BMI.  Treatment Plan Summary: Patient poses a risk of imminent danger to self, or others.      Patient recommended for  inpatient treatment/continuous observation for crisis stabilization, mood stabilization and medication management.  De Burrs, NP 04/11/2023 5:53 PM

## 2023-04-11 NOTE — ED Notes (Signed)
Pt sleeping and screaming in his dreams.

## 2023-04-11 NOTE — Progress Notes (Signed)
Patient has been denied by [facility] due to no appropriate beds available. Patient meets BH inpatient criteria per Tivis Ringer, NP. Patient has been faxed out to the following facilities:   [facility] Sent--[address] [place] [phone]--[facility] Patient PlacementPending - Request [facility], [facility] [phone]--[facility] - Request [address] [facility] [phone]--[facility] - Request [address], [place] [phone]--[facility] Request Sent--[address] [place] [phone]--[facility] Sent--[address] [place] [phone]--[facility] - [facility] Sent--[address] [facility] [address] Adult CampusPending - Request Sent--[address] [place] [phone]--VQMGQ-QPYPP [facility] HealthPending - Request [address] [place] [phone]--JQBHA-LPFXTKW HealthPending - Request Sent--[address], [place] [phone]--LGXQJ[facility] HealthPending - Request Sent--[phone]--[facility] - Request Sent--[address] [place] [phone]--OYDXA-JO[facility] - Request Sent--[address] [place] [phone]--YTKPT-WSF [facility] HealthPending - Request Sent--[address] [place] [phone]--GYKZL-DJT Luvenia Redden - Request Sent--[address],  [place] [phone]--[facility] HealthPending - Request [address] [place] [phone]--HLKTG[facility]BWLSL[facility] - Request [address] [place] [phone]--GTXMI[facility] [place] - Request Sent--[address] [place] [phone]--[facility] Behavioral HealthPending - Request [facility], [place] [phone]--[facility] - Request Sent--[phone]--   Damita Dunnings, MSW, LCSW-A  7:35 PM 04/11/2023

## 2023-04-11 NOTE — ED Notes (Signed)
Pt sleeping in recliner bed. Denies SI/ HI/.AVH. No noted distress. Will continue to monitor for safety

## 2023-04-11 NOTE — ED Notes (Signed)
Patient A&Ox4. Denies intent to harm self/others when asked. Denies AH. Reports Visual Hallucinations of people peeping through the window, cracks appearing on the wall, and water seeping out of the wall. Patient denies any physical complaints when asked. The patient appears anxious and paranoid. Reports people are out to get him. Seen pacing the unit, talking on the phone while crying. Support and encouragement provided. Routine safety checks conducted according to facility protocol. Encouraged patient to notify staff if thoughts of harm toward self or others arise. Endorses safety. Patient verbalized understanding and agreement. Will continue to monitor for safety.

## 2023-04-11 NOTE — ED Notes (Signed)
Patient resting with eyes closed. Respirations even and unlabored. No acute distress noted. Environment secured. Will continue to monitor for safety.

## 2023-04-11 NOTE — ED Notes (Signed)
The patient is seen pacing the unit, crying while on the phone. Reports increasing agitation. Please see MAR for medication administration. No acute distress noted. Environment is secured. Will continue to monitor for safety.

## 2023-04-12 ENCOUNTER — Encounter (HOSPITAL_COMMUNITY): Payer: Self-pay

## 2023-04-12 ENCOUNTER — Other Ambulatory Visit: Payer: Self-pay

## 2023-04-12 ENCOUNTER — Inpatient Hospital Stay (HOSPITAL_COMMUNITY)
Admission: AD | Admit: 2023-04-12 | Discharge: 2023-04-14 | DRG: 897 | Disposition: A | Discharge status: Home / Self Care | Payer: No Typology Code available for payment source | Source: Intra-hospital | Attending: Psychiatry | Admitting: Psychiatry

## 2023-04-12 DIAGNOSIS — F22 Delusional disorders: Secondary | ICD-10-CM

## 2023-04-12 DIAGNOSIS — R45851 Suicidal ideations: Secondary | ICD-10-CM | POA: Diagnosis present

## 2023-04-12 DIAGNOSIS — E871 Hypo-osmolality and hyponatremia: Secondary | ICD-10-CM | POA: Diagnosis present

## 2023-04-12 DIAGNOSIS — R443 Hallucinations, unspecified: Secondary | ICD-10-CM

## 2023-04-12 DIAGNOSIS — F1994 Other psychoactive substance use, unspecified with psychoactive substance-induced mood disorder: Principal | ICD-10-CM | POA: Diagnosis present

## 2023-04-12 DIAGNOSIS — Z87891 Personal history of nicotine dependence: Secondary | ICD-10-CM | POA: Diagnosis not present

## 2023-04-12 DIAGNOSIS — F1914 Other psychoactive substance abuse with psychoactive substance-induced mood disorder: Secondary | ICD-10-CM | POA: Diagnosis present

## 2023-04-12 DIAGNOSIS — F14151 Cocaine abuse with cocaine-induced psychotic disorder with hallucinations: Secondary | ICD-10-CM | POA: Diagnosis present

## 2023-04-12 DIAGNOSIS — F19959 Other psychoactive substance use, unspecified with psychoactive substance-induced psychotic disorder, unspecified: Secondary | ICD-10-CM | POA: Diagnosis present

## 2023-04-12 DIAGNOSIS — F419 Anxiety disorder, unspecified: Secondary | ICD-10-CM | POA: Diagnosis present

## 2023-04-12 DIAGNOSIS — F411 Generalized anxiety disorder: Secondary | ICD-10-CM

## 2023-04-12 DIAGNOSIS — F1910 Other psychoactive substance abuse, uncomplicated: Secondary | ICD-10-CM | POA: Diagnosis present

## 2023-04-12 DIAGNOSIS — F1410 Cocaine abuse, uncomplicated: Secondary | ICD-10-CM | POA: Diagnosis present

## 2023-04-12 HISTORY — DX: Other psychoactive substance use, unspecified with psychoactive substance-induced mood disorder: F19.94

## 2023-04-12 HISTORY — DX: Other psychoactive substance use, unspecified with psychoactive substance-induced psychotic disorder, unspecified: F19.959

## 2023-04-12 HISTORY — DX: Other psychoactive substance abuse, uncomplicated: F19.10

## 2023-04-12 IMAGING — DX DG TOE GREAT 2+V*R*
3 series · 3 of 3 positions shown · non-contrast
Comparison: None.

CLINICAL DATA: Right great toe dislocation

EXAM:
RIGHT GREAT TOE

[toe ap]
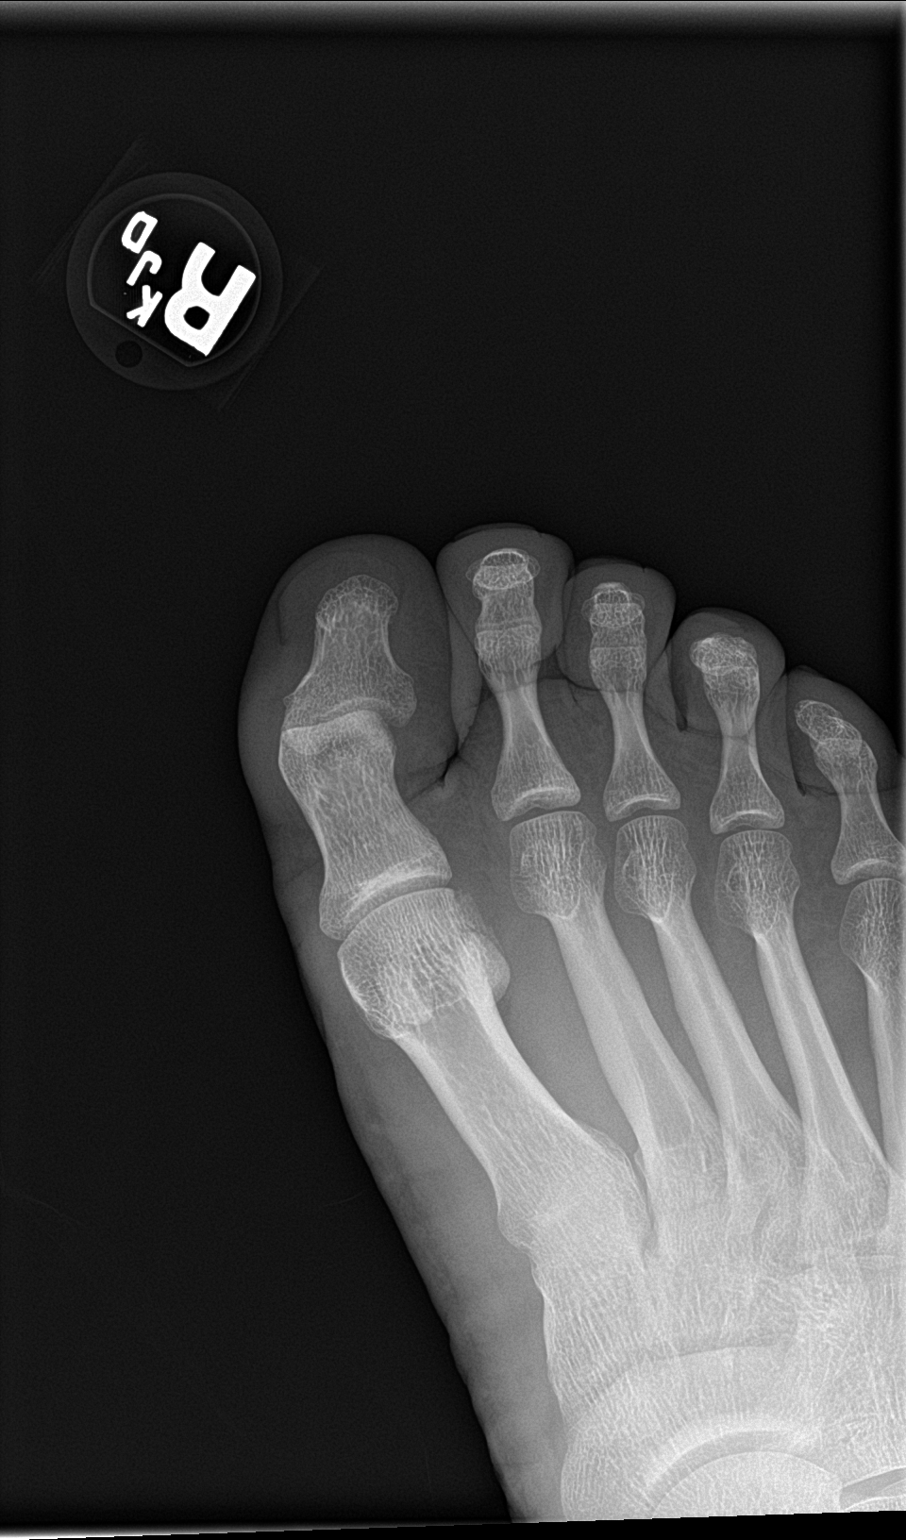

[toe obl]
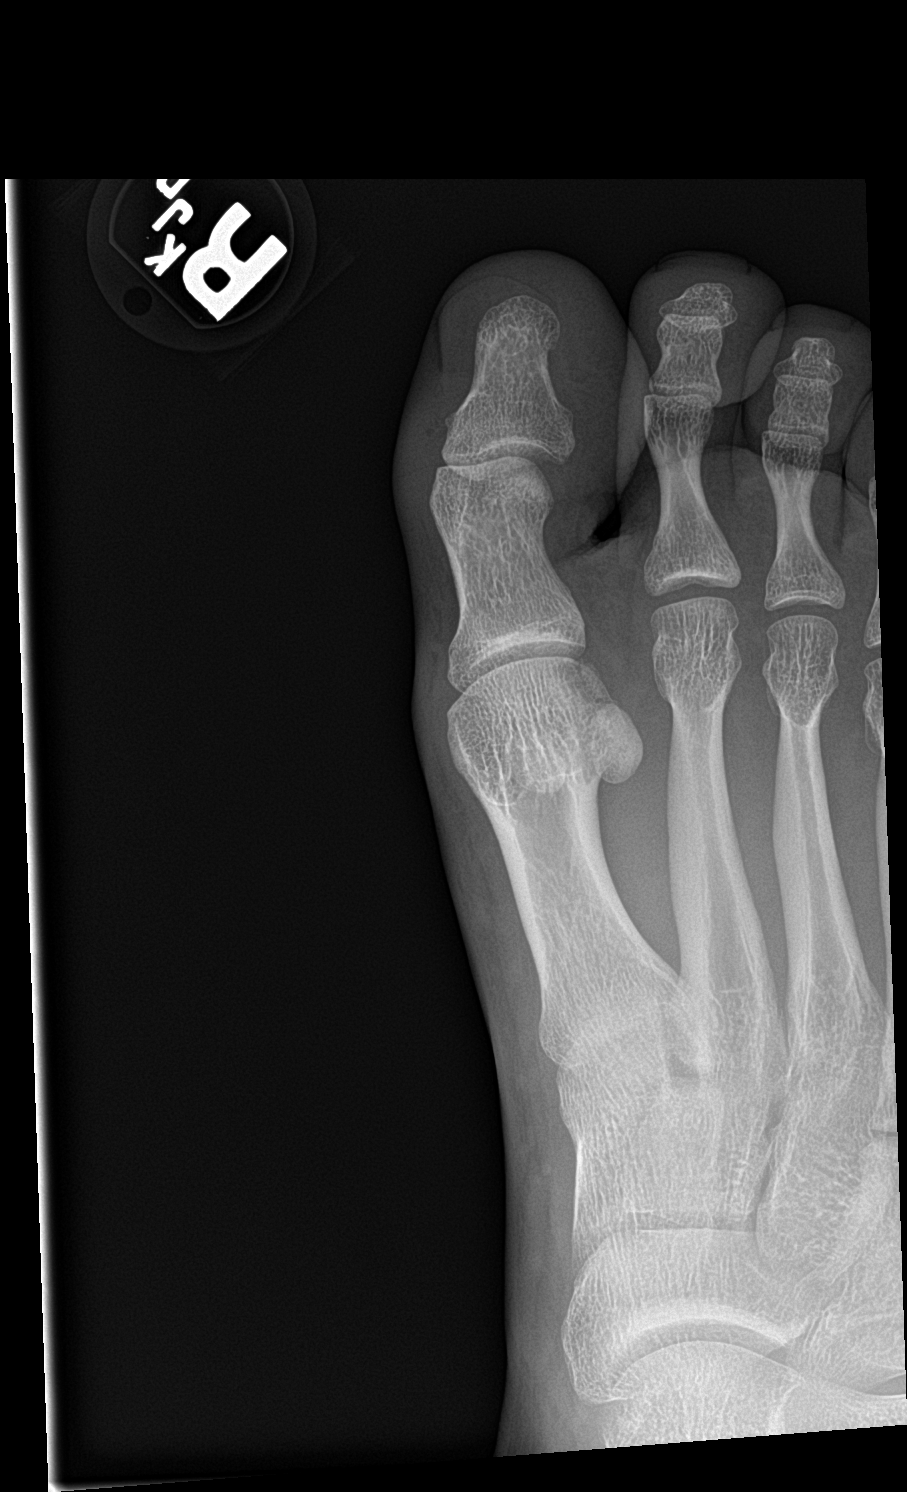

[toe lat]
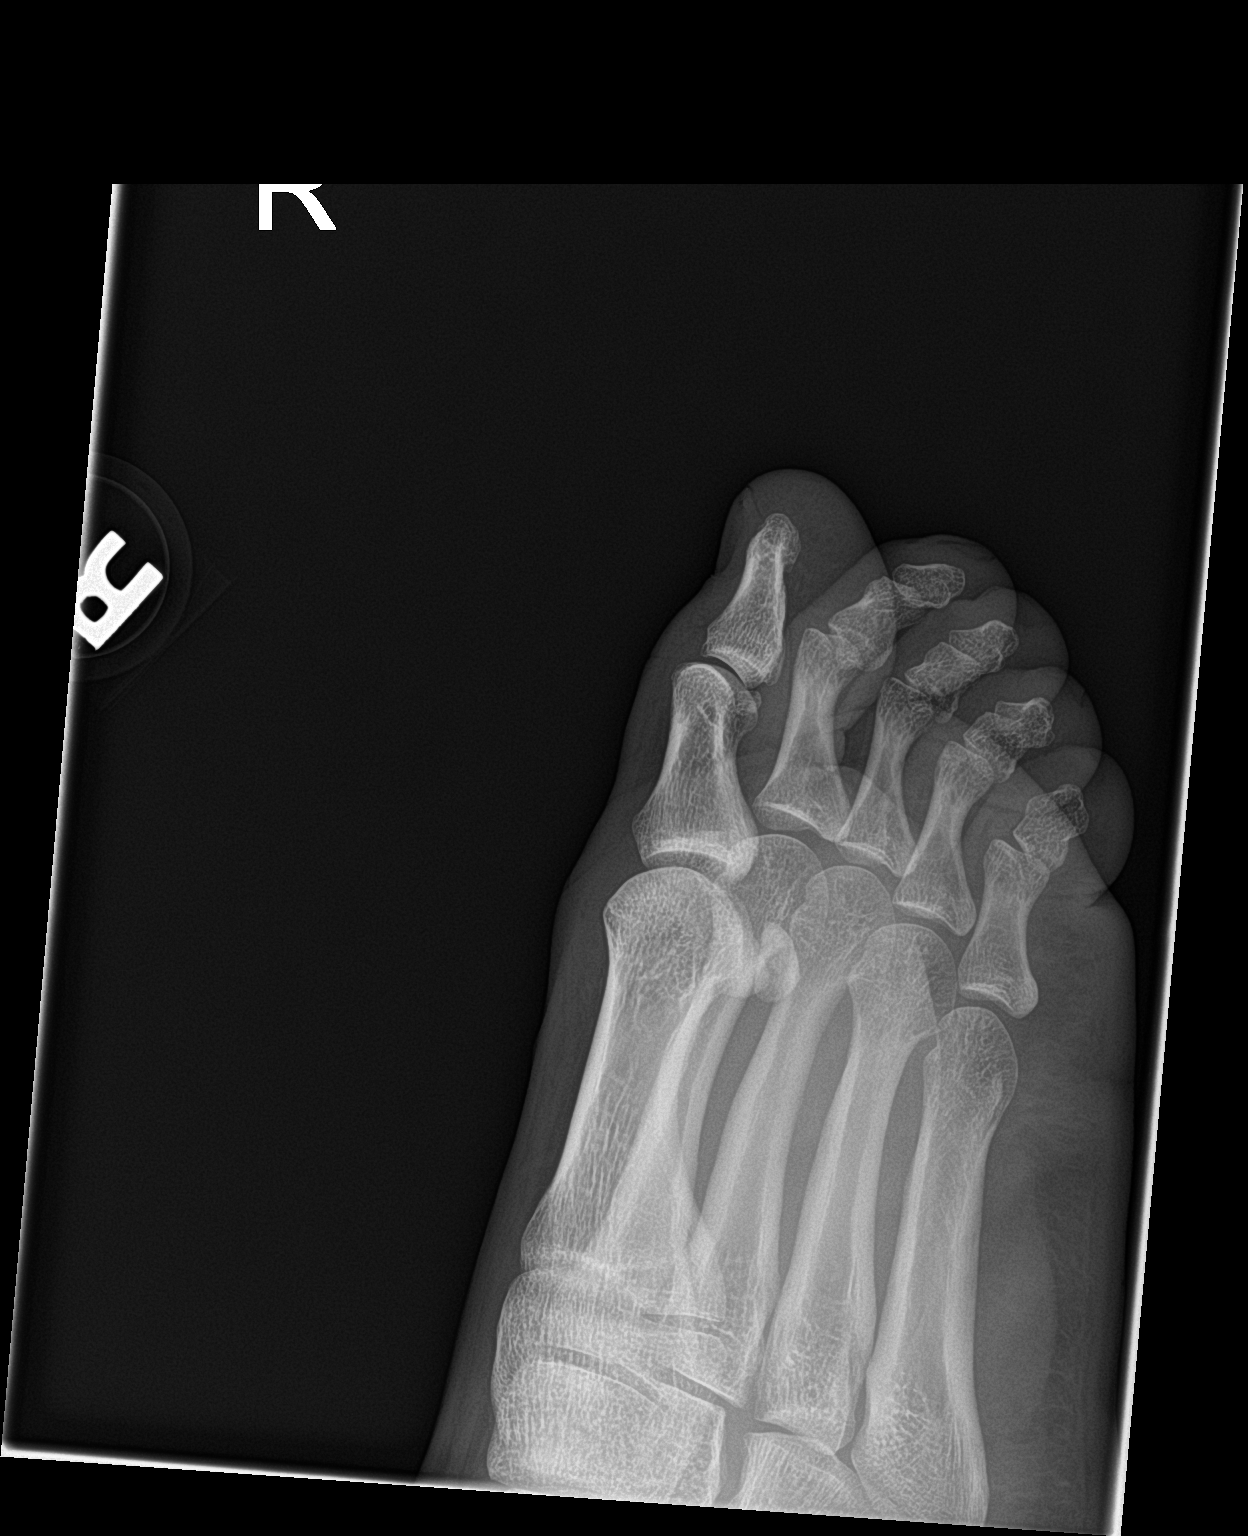

[3 of 3 positions shown; findings below may reference images not displayed]

FINDINGS: Normal alignment. No acute fracture or dislocation. Periarticular
soft tissue swelling involving the interphalangeal joint of the
right great toe is again noted.
IMPRESSION: No fracture or dislocation.

## 2023-04-12 MED ORDER — HYDROXYZINE HCL 25 MG PO TABS
25.0000 mg | ORAL_TABLET | Freq: Three times a day (TID) | ORAL | Status: DC | PRN
  Administered 2023-04-13 (×2): 25 mg via ORAL
  Filled 2023-04-12 (×2): qty 1

## 2023-04-12 MED ORDER — LORAZEPAM 2 MG/ML IJ SOLN: 2.0000 mg | Freq: Three times a day (TID) | INTRAMUSCULAR | Status: DC | PRN

## 2023-04-12 MED ORDER — ACETAMINOPHEN 325 MG PO TABS: 650.0000 mg | ORAL_TABLET | Freq: Four times a day (QID) | ORAL | Status: DC | PRN

## 2023-04-12 MED ORDER — HALOPERIDOL LACTATE 5 MG/ML IJ SOLN: 5.0000 mg | Freq: Three times a day (TID) | INTRAMUSCULAR | Status: DC | PRN

## 2023-04-12 MED ORDER — DIPHENHYDRAMINE HCL 50 MG/ML IJ SOLN: 50.0000 mg | Freq: Three times a day (TID) | INTRAMUSCULAR | Status: DC | PRN

## 2023-04-12 MED ORDER — OLANZAPINE 5 MG PO TBDP
5.0000 mg | ORAL_TABLET | Freq: Every day | ORAL | Status: DC
  Administered 2023-04-12 – 2023-04-13 (×2): 5 mg via ORAL
  Filled 2023-04-12 (×4): qty 1

## 2023-04-12 MED ORDER — DIPHENHYDRAMINE HCL 25 MG PO CAPS: 50.0000 mg | ORAL_CAPSULE | Freq: Three times a day (TID) | ORAL | Status: DC | PRN

## 2023-04-12 MED ORDER — ALUM & MAG HYDROXIDE-SIMETH 200-200-20 MG/5ML PO SUSP
30.0000 mL | ORAL | Status: DC | PRN
Start: 2023-04-12 — End: 2023-04-14

## 2023-04-12 MED ORDER — MAGNESIUM HYDROXIDE 400 MG/5ML PO SUSP
30.0000 mL | Freq: Every day | ORAL | Status: DC | PRN
Start: 2023-04-12 — End: 2023-04-14

## 2023-04-12 MED ORDER — HALOPERIDOL 5 MG PO TABS: 5.0000 mg | ORAL_TABLET | Freq: Three times a day (TID) | ORAL | Status: DC | PRN

## 2023-04-12 MED ORDER — OLANZAPINE 5 MG PO TBDP: 5.0000 mg | ORAL_TABLET | Freq: Every day | ORAL | 0 refills | Status: DC

## 2023-04-12 MED ORDER — TRAZODONE HCL 50 MG PO TABS
50.0000 mg | ORAL_TABLET | Freq: Every evening | ORAL | Status: DC | PRN
  Administered 2023-04-12 – 2023-04-13 (×2): 50 mg via ORAL
  Filled 2023-04-12 (×2): qty 1

## 2023-04-12 MED ORDER — HALOPERIDOL LACTATE 5 MG/ML IJ SOLN: 10.0000 mg | Freq: Three times a day (TID) | INTRAMUSCULAR | Status: DC | PRN

## 2023-04-12 NOTE — Plan of Care (Signed)

## 2023-04-12 NOTE — ED Notes (Signed)
Pt observed/assessed in recliner sleeping. RR even and unlabored, appearing in no noted distress. Environmental check complete, will continue to monitor for safety 

## 2023-04-12 NOTE — ED Notes (Signed)
Patient alert and oriented.  Denies SI, HI, AH, and pain. Pt reports visual hallucinations of steam coming off the furniture.  Pt also reports he feels like "something is being sprayed" motioning at the air.  Scheduled medications administered to patient, per MD orders. Support and encouragement provided.  Routine safety checks conducted every hour.  Patient informed to notify staff with problems or concerns. No adverse drug reactions noted. Patient contracts for safety at this time. Patient compliant with medications and treatment plan. Patient receptive, calm, and cooperative. Patient interacts well with others on the unit.  Patient remains safe at this time.

## 2023-04-12 NOTE — Progress Notes (Signed)
   04/12/23 1945  Psych Admission Type (Psych Patients Only)  Admission Status Voluntary  Psychosocial Assessment  Patient Complaints Substance abuse  Eye Contact Fair  Facial Expression Anxious  Affect Preoccupied  Speech Logical/coherent  Interaction Cautious;Assertive  Motor Activity Fidgety  Appearance/Hygiene Designer, industrial/product Cooperative;Anxious  Mood Depressed;Anxious;Suspicious  Aggressive Behavior  Effect No apparent injury  Thought Process  Coherency Circumstantial  Content Blaming self  Delusions WDL  Perception Hallucinations  Hallucination Auditory  Judgment Impaired  Confusion WDL  Danger to Self  Current suicidal ideation? Denies  Danger to Others  Danger to Others None reported or observed

## 2023-04-12 NOTE — Plan of Care (Signed)
  Problem: Education: Goal: Emotional status will improve Outcome: Progressing Goal: Mental status will improve Outcome: Progressing   Problem: Activity: Goal: Interest or engagement in activities will improve Outcome: Progressing   Problem: Safety: Goal: Periods of time without injury will increase Outcome: Progressing

## 2023-04-12 NOTE — ED Provider Notes (Signed)
FBC/OBS ASAP Discharge Summary  Date and Time: 04/12/2023 11:47 AM  Name: Dylan Gay  MRN:  [id number]   Discharge Diagnoses:  Final diagnoses:  Hallucination  Anxiety state  Substance abuse (HCC)  Paranoia (HCC)  Suicidal ideation    Subjective: Patient seen face-to-face by this provider.  Case discussed with attending psychiatrist, Dr. Enedina Finner.  During today's evaluation, the patient reports feeling better overall.  However, he continues to endorse paranoid ideation and delusions, specifically believing that a man and a woman living in his apartment complex have had both of his phones, allowing the woman to monitor all activity on his devices.  He reports his appetite is satisfactory, though he reports continued interrupted sleep.  He rates his anxiety at 5/10 and his depression at 8/10 on a scale of 1-10, with 10 being the most severe.  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  He denies any current suicidal ideation, homicidal ideation, or auditory or visual hallucinations.    Stay Summary: Dylan Gay A. 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 Patient exhibited paranoia, and behavior, auditory hallucinations (hearing voices), and visual hallucinations, such as seeing someone in the room. While the hallucinations have reportedly subsided, he continues to experience paranoia and delusional thinking.  During the patient's stay, he was started on olanzapine disintegrating, 5 mg, oral, Daily at bedtime for hallucinations, paranoia, and anxiety.    Total Time spent with patient: 45 minutes  Past Psychiatric History: On chart review:   Patient reported he has diagnosis of anxiety   Past Medical History: On chart review: Patient reported he had a seizure from heat exhaustion  Family History: No pertinent family history reported.  Family Psychiatric History: No pertinent family psychiatric history reported  Social History: Patient is employed as an Engineer, site. Maintains a home and lives with a roommate. Patient uses marijuana and cocaine.  Tobacco Cessation:  N/A, patient does not currently use tobacco products  Current Medications:  Current Facility-Administered Medications  Medication Dose Route Frequency Provider Last Rate Last Admin   acetaminophen (TYLENOL) tablet 650 mg  650 mg Oral Q6H PRN Onuoha, Chinwendu V, NP       alum & mag hydroxide-simeth (MAALOX/MYLANTA) 200-200-20 MG/5ML suspension 30 mL  30 mL Oral Q4H PRN Onuoha, Chinwendu V, NP       hydrOXYzine (ATARAX) tablet 25 mg  25 mg Oral TID PRN Onuoha, Chinwendu V, NP   25 mg at 04/11/23 2123   magnesium hydroxide (MILK OF MAGNESIA) suspension 30 mL  30 mL Oral Daily PRN Onuoha, Chinwendu V, NP       OLANZapine zydis (ZYPREXA) disintegrating tablet 5 mg  5 mg Oral Q8H PRN Onuoha, Chinwendu V, NP   5 mg at 04/11/23 1613   OLANZapine zydis (ZYPREXA) disintegrating tablet 5 mg  5 mg Oral QHS McLauchlin, Angela, NP   5 mg at 04/11/23 2123   traZODone (DESYREL) tablet 50 mg  50 mg Oral QHS PRN Onuoha, Chinwendu V, NP   50 mg at 04/11/23 2123   ziprasidone (GEODON) injection 20 mg  20 mg Intramuscular Q12H PRN Onuoha, Chinwendu V, NP   20 mg at 04/11/23 1345   No current outpatient medications  on file.    PTA Medications:  Facility Ordered Medications  Medication   acetaminophen (TYLENOL) tablet 650 mg   alum & mag hydroxide-simeth (MAALOX/MYLANTA) 200-200-20 MG/5ML suspension 30 mL   magnesium hydroxide (MILK OF MAGNESIA) suspension 30 mL   hydrOXYzine (ATARAX) tablet 25 mg   traZODone (DESYREL) tablet 50 mg   [COMPLETED] ziprasidone (GEODON) injection  20 mg   [COMPLETED] potassium chloride SA (KLOR-CON M) CR tablet 40 mEq   OLANZapine zydis (ZYPREXA) disintegrating tablet 5 mg   [COMPLETED] LORazepam (ATIVAN) injection 1 mg   ziprasidone (GEODON) injection 20 mg   OLANZapine zydis (ZYPREXA) disintegrating tablet 5 mg        No data to display          Flowsheet Row ED from 04/10/2023 in [facility] ED from 04/09/2023 in [facility] at [facility] ED from 11/17/2022 in [facility] at [facility]  C-SSRS RISK CATEGORY No Risk No Risk No Risk       Musculoskeletal  Strength & Muscle Tone: within normal limits Gait & Station: normal Patient leans: N/A  Psychiatric Specialty Exam  Presentation  General Appearance:  Appropriate for Environment; Fairly Groomed  Eye Contact: Good  Speech: Clear and Coherent  Speech Volume: Normal  Handedness: Right   Mood and Affect  Mood: Euthymic  Affect: Congruent   Thought Process  Thought Processes: Coherent; Goal Directed; Linear  Descriptions of Associations:Loose  Orientation:Full (Time, Place and Person)  Thought Content:Paranoid Ideation  Diagnosis of Schizophrenia or Schizoaffective disorder in past: No  Duration of Psychotic Symptoms: Less than six months   Hallucinations:Hallucinations: None Description of Auditory Hallucinations: Voices say you are "fucked up" Description of Visual Hallucinations: Can see black snake heads and spray coming from cracks  Ideas of Reference:Paranoia; Percusatory  Suicidal Thoughts:Suicidal Thoughts: No SI Active Intent and/or Plan: With Intent  Homicidal Thoughts:Homicidal Thoughts: No   Sensorium  Memory: Immediate Good; Recent Good  Judgment: Fair  Insight: Fair   Chartered certified accountant: Fair  Attention Span: Fair  Recall: [employer] of Knowledge: Fair  Language: Fair   Psychomotor Activity   Psychomotor Activity: Psychomotor Activity: Normal   Assets  Assets: Desire for Improvement; Resilience   Sleep  Sleep: Sleep: Fair Number of Hours of Sleep: 4   Nutritional Assessment (For OBS and FBC admissions only) Has the patient had a weight loss or gain of 10 pounds or more in the last 3 months?: No Has the patient had a decrease in food intake/or appetite?: No Does the patient have dental problems?: No Does the patient have eating habits or behaviors that may be indicators of an eating disorder including binging or inducing vomiting?: No Has the patient recently lost weight without trying?: 0 Has the patient been eating poorly because of a decreased appetite?: 0 Malnutrition Screening Tool Score: 0    Physical Exam  Physical Exam ROS Blood pressure 130/83, pulse 90, temperature 98 F (36.7 C), temperature source Oral, resp. rate 18, SpO2 98%. There is no height or weight on file to calculate BMI.   Plan Of Care/Follow-up recommendations:  Inpatient psychiatric hospitalization recommended for depressive symptoms, ongoing paranoia and persecutory ideations.   Disposition: Patient accepted to [facility].  Norma Fredrickson, NP 04/12/2023, 11:47 AM

## 2023-04-12 NOTE — ED Notes (Signed)
Pt is currently sleeping, no distress noted, environmental check complete, will continue to monitor patient for safety.  

## 2023-04-12 NOTE — ED Notes (Signed)
Pt resting quietly with eyes closed.  No pain or discomfort noted/voiced.  Breathing is even and unlabored.  Will continue to monitor for safety.  

## 2023-04-12 NOTE — Progress Notes (Signed)
Patient is 27yo male from [facility]  in the context of suicidal ideation. PMH of seizures (last one 4 years ago), anxiety, and substance abuse. NKA allergies. At home pt is taking no medications. No past surgical history. Pt denies being sexually active, tobacco use, alcohol use, and vaping. Pt endorses cocaine and marijuana use. Pt urine was also positive for benzos and amphetamines. Pt states he does not remember taking those pills. Pt denies HI. Pt denies physical, verbal, sexual abuse.  Pt was educated on unit policies and educated on unit rules. Pt remains safe on Q15 min checks and contracts for safety.  Pt is here voluntarily after taking substances and becoming paranoid, pt reports having SI thoughts and persecutory delusions. Pt stated he felt like people were after him. Pt denies paranoia presently. Pt states that he was hallucinating seeing snakes and steam coming off of furniture. Pt denies current hallucinations and does not appear to be responding to internal stimuli. Pt is pleasant. Pt states he just needs to be here for a day or two. Pt reports his symptoms were due to his substance abuse and states he wants to "get right and clear my mind".       04/12/23 1609  Psych Admission Type (Psych Patients Only)  Admission Status Voluntary  Psychosocial Assessment  Patient Complaints Substance abuse;Depression  Eye Contact Fair  Facial Expression Anxious  Affect Preoccupied;Depressed  Speech Logical/coherent;Rapid;Pressured  Interaction Cautious;Assertive  Motor Activity Restless  Appearance/Hygiene Designer, industrial/product Cooperative;Anxious  Mood Depressed;Preoccupied  Thought Process  Coherency Circumstantial  Content Blaming self  Delusions None reported or observed  Perception WDL  Hallucination None reported or observed  Judgment Impaired  Confusion None  Danger to Self  Current suicidal ideation? Denies  Agreement Not to Harm Self Yes  Description of Agreement  verbal  Danger to Others  Danger to Others None reported or observed

## 2023-04-12 NOTE — [facility] Group Notes (Signed)
Adult Psychoeducational Group Note  Date:  04/12/2023 Time:  8:15 PM  Group Topic/Focus:  Wrap-Up Group:   The focus of this group is to help patients review their daily goal of treatment and discuss progress on daily workbooks.  Participation Level:  Active  Participation Quality:  Appropriate  Affect:  Appropriate  Cognitive:  Appropriate  Insight: Appropriate  Engagement in Group:  Engaged  Modes of Intervention:  Discussion  Additional Comments:   Today is cristians first day on the hall. Pt states that he's had a great day and its been better than the past 3 days Pt states that his anxiety and depression is getting better and that his AVH has been lessening. Pt is getting more sleep and had a positive visit with his more. Pt endorsed anxiety at a 5 and depression at 7. Pt states he's proud of himself for seeking help  [name] 04/12/2023, 8:15 PM

## 2023-04-12 NOTE — ED Notes (Signed)
Report has been called.  Transportation will be safe transport.  Pt has signed vol form and I aware he will be going to [facility].

## 2023-04-13 ENCOUNTER — Encounter (HOSPITAL_COMMUNITY): Payer: Self-pay

## 2023-04-13 DIAGNOSIS — F1994 Other psychoactive substance use, unspecified with psychoactive substance-induced mood disorder: Secondary | ICD-10-CM | POA: Diagnosis not present

## 2023-04-13 DIAGNOSIS — F1910 Other psychoactive substance abuse, uncomplicated: Secondary | ICD-10-CM | POA: Diagnosis present

## 2023-04-13 LAB — COMPREHENSIVE METABOLIC PANEL
ALT: 57 U/L — ABNORMAL HIGH (ref 0–44)
AST: 40 U/L (ref 15–41)
Albumin: 4 g/dL (ref 3.5–5.0)
Alkaline Phosphatase: 88 U/L (ref 38–126)
Anion gap: 10 (ref 5–15)
BUN: 11 mg/dL (ref 6–20)
CO2: 21 mmol/L — ABNORMAL LOW (ref 22–32)
Calcium: 8.5 mg/dL — ABNORMAL LOW (ref 8.9–10.3)
Chloride: 107 mmol/L (ref 98–111)
Creatinine, Ser: 0.83 mg/dL (ref 0.61–1.24)
GFR, Estimated: >60 mL/min (ref >60)
Glucose, Bld: 127 mg/dL — ABNORMAL HIGH (ref 70–99)
Potassium: 3.6 mmol/L (ref 3.5–5.1)
Sodium: 138 mmol/L (ref 135–145)
Total Bilirubin: 0.3 mg/dL (ref <1.2)
Total Protein: 6.7 g/dL (ref 6.5–8.1)

## 2023-04-13 LAB — VITAMIN D 25 HYDROXY (VIT D DEFICIENCY, FRACTURES): Vit D, 25-Hydroxy: 18.48 ng/mL — ABNORMAL LOW (ref 30–100)

## 2023-04-13 LAB — LIPID PANEL
Cholesterol: 127 mg/dL (ref 0–200)
HDL: 33 mg/dL — ABNORMAL LOW (ref >40)
LDL Cholesterol: 70 mg/dL (ref 0–99)
Total CHOL/HDL Ratio: 3.8 {ratio}
Triglycerides: 120 mg/dL (ref <150)
VLDL: 24 mg/dL (ref 0–40)

## 2023-04-13 LAB — VITAMIN B12: Vitamin B-12: 265 pg/mL (ref 180–914)

## 2023-04-13 LAB — FOLATE: Folate: 11.3 ng/mL (ref >5.9)

## 2023-04-13 NOTE — BHH Suicide Risk Assessment (Signed)
[facility] Admission Suicide Risk Assessment   Nursing information obtained from:  Patient Demographic factors:  Male Current Mental Status:  Suicidal ideation indicated by patient Loss Factors:  NA Historical Factors:  NA Risk Reduction Factors:  Positive therapeutic relationship, Positive social support  Total Time spent with patient: 1 hour Principal Problem: Substance induced mood disorder (HCC) Diagnosis:  Principal Problem:   Substance induced mood disorder (HCC) Active Problems:   Psychoactive substance-induced psychosis (HCC)  Subjective Data: Dylan Gay is a 27 y.o. male who has no past medical history on file. He presented from an outside hospital for Substance induced mood disorder (HCC) [F19.94].  He reported auditory and visual hallucinations and suicidal ideation.  Continued Clinical Symptoms:  Alcohol Use Disorder Identification Test Final Score (AUDIT): 0 The "Alcohol Use Disorders Identification Test", Guidelines for Use in Primary Care, Second Edition.  World Science writer [facility]). Score between 0-7:  no or low risk or alcohol related problems. Score between 8-15:  moderate risk of alcohol related problems. Score between 16-19:  high risk of alcohol related problems. Score 20 or above:  warrants further diagnostic evaluation for alcohol dependence and treatment.   CLINICAL FACTORS:   Severe Anxiety and/or Agitation Alcohol/Substance Abuse/Dependencies Currently Psychotic Unstable or Poor Therapeutic Relationship   Musculoskeletal: Strength & Muscle Tone: within normal limits Gait & Station: normal Patient leans: N/A   Psychiatric Specialty Exam:  Presentation  General Appearance:  Disheveled  Eye Contact: Good  Speech: Clear and Coherent  Speech Volume: Normal  Handedness: Right   Mood and Affect  Mood: Euthymic  Affect: Full Range   Thought Process  Thought Processes: Linear  Descriptions of  Associations:Intact  Orientation:Full (Time, Place and Person)  Thought Content:Paranoid Ideation  History of Schizophrenia/Schizoaffective disorder:No  Duration of Psychotic Symptoms:Less than six months  Hallucinations:Hallucinations: None  Ideas of Reference:None  Suicidal Thoughts:Suicidal Thoughts: No  Homicidal Thoughts:Homicidal Thoughts: No   Sensorium  Memory: Immediate Good; Recent Good  Judgment: Fair  Insight: Fair   Art therapist  Concentration: Good  Attention Span: Good  Recall: Good  Fund of Knowledge: Good  Language: Good   Psychomotor Activity  Psychomotor Activity: Psychomotor Activity: Normal   Assets  Assets: Communication Skills; Housing   Sleep  Sleep: Sleep: Good    Physical Exam: General: Sitting comfortably. NAD. HEENT: Normocephalic, atraumatic, MMM, EMOI Lungs: no increased work of breathing noted Heart: no cyanosis Abdomen: Non distended Musculoskeletal: FROM. No obvious deformities Skin: Warm, dry, intact. No rashes noted Neuro: No obvious focal deficits.  Gait and station are normal  Review of Systems  Constitutional: Negative.   HENT: Negative.    Eyes: Negative.   Respiratory: Negative.    Cardiovascular: Negative.   Gastrointestinal: Negative.   Genitourinary: Negative.   Skin: Negative.   Neurological: Negative.   Psychiatric/Behavioral:  Positive for paranoia.    Blood pressure 108/77, pulse 80, temperature 98.1 F (36.7 C), temperature source Oral, resp. rate 16, height 5\' 4"  (1.626 m), weight 83.9 kg, SpO2 100%. Body mass index is 31.76 kg/m.   COGNITIVE FEATURES THAT CONTRIBUTE TO RISK:  None    SUICIDE RISK:   Mild:  Suicidal ideation of limited frequency, intensity, duration, and specificity.  There are no identifiable plans, no associated intent, mild dysphoria and related symptoms, good self-control (both objective and subjective assessment), few other risk factors, and  identifiable protective factors, including available and accessible social support.  PLAN OF CARE: Medication management, group therapy, milieu therapy.  See  H&P for complete plan of care.  I certify that inpatient services furnished can reasonably be expected to improve the patient's condition.   Golda Acre, MD 04/13/2023, 12:26 PM

## 2023-04-13 NOTE — Progress Notes (Signed)
   04/13/23 2200  Psych Admission Type (Psych Patients Only)  Admission Status Voluntary  Psychosocial Assessment  Patient Complaints Anxiety;Depression  Eye Contact Fair  Facial Expression Anxious  Affect Anxious  Speech Logical/coherent  Interaction Assertive  Motor Activity Other (Comment) (WNL)  Appearance/Hygiene Unremarkable  Behavior Characteristics Cooperative  Mood Anxious  Thought Process  Coherency Circumstantial  Content WDL  Delusions None reported or observed  Perception WDL  Hallucination None reported or observed  Judgment Impaired  Confusion None  Danger to Self  Current suicidal ideation? Denies  Description of Suicide Plan None  Agreement Not to Harm Self Yes  Description of Agreement Verbal Contract for Safety  Danger to Others  Danger to Others None reported or observed  Danger to Others Abnormal  Harmful Behavior to others No threats or harm toward other people  Destructive Behavior No threats or harm toward property

## 2023-04-13 NOTE — Plan of Care (Signed)
  Problem: Education: Goal: Knowledge of [facility] [facility] Education information/materials will improve Outcome: Progressing Goal: Emotional status will improve Outcome: Progressing Goal: Mental status will improve Outcome: Progressing Goal: Verbalization of understanding the information provided will improve Outcome: Progressing   Problem: Coping: Goal: Ability to demonstrate self-control will improve Outcome: Progressing   Problem: Safety: Goal: Periods of time without injury will increase Outcome: Progressing

## 2023-04-13 NOTE — Group Note (Signed)
Recreation Therapy Group Note   Group Topic:Communication  Group Date: 04/13/2023 Start Time: 1010 End Time: 1040 Facilitators: Yosselin Zoeller-McCall, LRT,CTRS Location: [address] Dayroom   Group Topic: Communication, Problem Solving   Goal Area(s) Addresses:  Patient will effectively listen to complete activity.  Patient will identify communication skills used to make activity successful.  Patient will identify how skills used during activity can be used to reach post d/c goals.    Intervention: Building surveyor Activity - Geometric pattern cards, pencils, blank paper    Group Description: Geometric Drawings.  Three volunteers from the peer group will be shown an abstract picture with a particular arrangement of geometrical shapes.  Each round, one 'speaker' will describe the pattern, as accurately as possible without revealing the image to the group.  The remaining group members will listen and draw the picture to reflect how it is described to them. Patients with the role of 'listener' cannot ask clarifying questions but, may request that the speaker repeat a direction. Once the drawings are complete, the presenter will show the rest of the group the picture and compare how close each person came to drawing the picture. LRT will facilitate a post-activity discussion regarding effective communication and the importance of planning, listening, and asking for clarification in daily interactions with others.     Education: Environmental consultant, Active listening, Support systems, Discharge planning   Education Outcome:  Acknowledges understanding/In group clarification offered/Needs additional education.    Affect/Mood: N/A   Participation Level: Did not attend    Clinical Observations/Individualized Feedback:     Plan: Continue to engage patient in RT group sessions 2-3x/week.   Demorris Choyce-McCall, LRT,CTRS 04/13/2023 12:08 PM

## 2023-04-13 NOTE — BHH Counselor (Signed)
Adult Comprehensive Assessment  Patient ID: Dylan Gay, male   DOB: 1996-01-28, 27 y.o.   MRN: [id number]  Information Source: Information source: Patient (PSA completed with pt)  Current Stressors:  Patient states their primary concerns and needs for treatment are:: " my major concerns is to get back to work" Patient states their goals for this hospitilization and ongoing recovery are:: " I want to get on some medications for anxiety, depression and I am not sleeping well" Educational / Learning stressors: None reported Employment / Job issues: None reported Family Relationships: " yes, my mother and I have disagreementsEngineer, petroleum / Lack of resources (include bankruptcy): " yes, we have gone thru some hard time with not having enough money" Housing / Lack of housing: None reported Physical health (include injuries & life threatening diseases): None reported Social relationships: None reported Substance abuse: " no, I just smoke weed everyday" Bereavement / Loss: " yes, my friend I had a friend to die recently"  Living/Environment/Situation:  Living Arrangements: Alone Living conditions (as described by patient or guardian): " I have an apt of my own" Who else lives in the home?: " well, I have a room mate but he is never here" How long has patient lived in current situation?: " several years"  Family History:  Marital status: Single Are you sexually active?: Yes What is your sexual orientation?: " I am straight" Has your sexual activity been affected by drugs, alcohol, medication, or emotional stress?: " no" Does patient have children?: No  Childhood History:  By whom was/is the patient raised?: Mother Additional childhood history information: na Description of patient's relationship with caregiver when they were a child: "I was with my grandfather in [place] until age 3, it was good. After 8 I came here to be with my mom and it was good too" Patient's  description of current relationship with people who raised him/her: " it is good, my mother and I have a good relationship" How were you disciplined when you got in trouble as a child/adolescent?: "whooping" Does patient have siblings?: Yes Number of Siblings: 2 Description of patient's current relationship with siblings: "good" Did patient suffer any verbal/emotional/physical/sexual abuse as a child?: No Did patient suffer from severe childhood neglect?: No Has patient ever been sexually abused/assaulted/raped as an adolescent or adult?: No Was the patient ever a victim of a crime or a disaster?: No Witnessed domestic violence?: No Has patient been affected by domestic violence as an adult?: No  Education:  Highest grade of school patient has completed: 10th Currently a student?: No Learning disability?: No  Employment/Work Situation:   Employment Situation: Employed 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?: No  Financial Resources:   Financial resources: Income from employment Does patient have a representative payee or guardian?: No  Alcohol/Substance Abuse:   What has been your use of drugs/alcohol within the last 12 months?: " I used cocaine 4 wks ago and I smoke weed daily" If attempted suicide, did drugs/alcohol play a role in this?: Yes Alcohol/Substance Abuse Treatment Hx: Denies past history If yes, describe treatment: na  Social Support System:  Patient's Community Support System: None Describe Community Support System: " I have community support" Type of faith/religion: " I don't go to church" How does  patient's faith help to cope with current illness?: " I pray"  Leisure/Recreation:   Do You Have Hobbies?: No  Strengths/Needs:   What is the patient's perception of their strengths?: " I am dependable" Patient states they can use these personal strengths during their treatment to contribute to their recovery: " I am not sure"" Patient states these barriers may affect/interfere with their treatment: " no barriers' Patient states these barriers may affect their return to the community: "no barriers"  Discharge Plan:   Currently receiving community mental health services: No Patient states concerns and preferences for aftercare planning are: " i want to have a therapist and somebody to prescribed my medicines" Patient states they will know when they are safe and ready for discharge when: " ... when I no longer paranoid" Does patient have access to transportation?: Yes Does patient have financial barriers related to discharge medications?: No Patient description of barriers related to discharge medications: " I will have cash to buy medicines" Will patient be returning to same living situation after discharge?: Yes  Summary/Recommendations:   Summary and Recommendations (to be completed by the evaluator): Dylan Gay is a 27 y.o.male voluntarily admitted to [facility] after presenting to [facility] due to hallucinations in the context of methamphetamine and cocaine use. Pt reported that he used meth for the first time and started seeing snakes. Pt reported stressors as family relationships, financial concerns, and death of friend. Pt denies SI/HI/AVH. Pt reported last use of drugs was a couple of days ago. Patient will benefit from crisis stabilization, medication evaluation, group therapy and psychoeducation, in addition to case management for discharge planning. At discharge it is recommended that Patient adhere to the established discharge plan and continue in treatment. Pt reported no outpatient providers but  requesting referrals to therapy and medication management following discharge.  Shaketha Jeon R. 04/13/2023

## 2023-04-13 NOTE — Progress Notes (Signed)
D: Patient is alert, oriented, pleasant, and cooperative. Denies SI, HI, AVH, and verbally contracts for safety. Patient reports anxiety.    A: PRN hydroxyzine administered. Support provided. Patient educated on safety on the unit and medications. Routine safety checks every 15 minutes. Patient stated understanding to tell nurse about any new physical symptoms. Patient understands to tell staff of any needs.     R: No adverse drug reactions noted. Patient remains safe at this time and will continue to monitor.    04/13/23 0900  Psych Admission Type (Psych Patients Only)  Admission Status Voluntary  Psychosocial Assessment  Patient Complaints Substance abuse;Anxiety  Eye Contact Fair  Facial Expression Anxious  Affect Depressed  Speech Logical/coherent  Interaction Assertive  Motor Activity Other (Comment) (WNL)  Appearance/Hygiene Disheveled  Behavior Characteristics Cooperative;Anxious  Mood Depressed;Anxious  Thought Process  Coherency Circumstantial  Content Blaming others  Delusions None reported or observed  Perception WDL  Hallucination None reported or observed  Judgment Impaired  Confusion None  Danger to Self  Current suicidal ideation? Denies  Agreement Not to Harm Self Yes  Description of Agreement verbal  Danger to Others  Danger to Others None reported or observed  Danger to Others Abnormal  Harmful Behavior to others No threats or harm toward other people  Destructive Behavior No threats or harm toward property

## 2023-04-13 NOTE — Plan of Care (Signed)
  Problem: Education: Goal: Knowledge of [place] [facility] Education information/materials will improve Outcome: Progressing Goal: Emotional status will improve Outcome: Progressing Goal: Mental status will improve Outcome: Progressing Goal: Verbalization of understanding the information provided will improve Outcome: Progressing   Problem: Activity: Goal: Interest or engagement in activities will improve Outcome: Progressing Goal: Sleeping patterns will improve Outcome: Progressing   Problem: Coping: Goal: Ability to verbalize frustrations and anger appropriately will improve Outcome: Progressing

## 2023-04-13 NOTE — BH IP Treatment Plan (Signed)
Interdisciplinary Treatment and Diagnostic Plan Update  04/13/2023 Time of Session:  10:40 AM Dylan Gay MRN: [id number]  Principal Diagnosis: Substance induced mood disorder (HCC)  Secondary Diagnoses: Principal Problem:   Substance induced mood disorder (HCC)   Current Medications:  Current Facility-Administered Medications  Medication Dose Route Frequency Provider Last Rate Last Admin   acetaminophen (TYLENOL) tablet 650 mg  650 mg Oral Q6H PRN Bennett, Christal H, NP       alum & mag hydroxide-simeth (MAALOX/MYLANTA) 200-200-20 MG/5ML suspension 30 mL  30 mL Oral Q4H PRN Bennett, Christal H, NP       haloperidol (HALDOL) tablet 5 mg  5 mg Oral TID PRN Bennett, Christal H, NP       And   diphenhydrAMINE (BENADRYL) capsule 50 mg  50 mg Oral TID PRN Bennett, Christal H, NP       haloperidol lactate (HALDOL) injection 5 mg  5 mg Intramuscular TID PRN Bennett, Christal H, NP       And   diphenhydrAMINE (BENADRYL) injection 50 mg  50 mg Intramuscular TID PRN Bennett, Christal H, NP       And   LORazepam (ATIVAN) injection 2 mg  2 mg Intramuscular TID PRN Bennett, Christal H, NP       haloperidol lactate (HALDOL) injection 10 mg  10 mg Intramuscular TID PRN Bennett, Christal H, NP       And   diphenhydrAMINE (BENADRYL) injection 50 mg  50 mg Intramuscular TID PRN Bennett, Christal H, NP       And   LORazepam (ATIVAN) injection 2 mg  2 mg Intramuscular TID PRN Bennett, Christal H, NP       hydrOXYzine (ATARAX) tablet 25 mg  25 mg Oral TID PRN Willeen Cass, Christal H, NP   25 mg at 04/13/23 0829   magnesium hydroxide (MILK OF MAGNESIA) suspension 30 mL  30 mL Oral Daily PRN Bennett, Christal H, NP       OLANZapine zydis (ZYPREXA) disintegrating tablet 5 mg  5 mg Oral QHS Bennett, Christal H, NP   5 mg at 04/12/23 2038   traZODone (DESYREL) tablet 50 mg  50 mg Oral QHS PRN Willeen Cass, Christal H, NP   50 mg at 04/12/23 2038   PTA Medications: Medications Prior to Admission   Medication Sig Dispense Refill Last Dose/Taking   OLANZapine zydis (ZYPREXA) 5 MG disintegrating tablet Take 1 tablet (5 mg total) by mouth at bedtime. 1 tablet 0     Patient Stressors:    Patient Strengths:    Treatment Modalities: Medication Management, Group therapy, Case management,  1 to 1 session with clinician, Psychoeducation, Recreational therapy.   Physician Treatment Plan for Primary Diagnosis: Substance induced mood disorder (HCC) Long Term Goal(s):     Short Term Goals:    Medication Management: Evaluate patient's response, side effects, and tolerance of medication regimen.  Therapeutic Interventions: 1 to 1 sessions, Unit Group sessions and Medication administration.  Evaluation of Outcomes: Not Progressing  Physician Treatment Plan for Secondary Diagnosis: Principal Problem:   Substance induced mood disorder (HCC)  Long Term Goal(s):     Short Term Goals:       Medication Management: Evaluate patient's response, side effects, and tolerance of medication regimen.  Therapeutic Interventions: 1 to 1 sessions, Unit Group sessions and Medication administration.  Evaluation of Outcomes: Not Progressing   RN Treatment Plan for Primary Diagnosis: Substance induced mood disorder (HCC) Long Term Goal(s): Knowledge of disease and therapeutic regimen  to maintain health will improve  Short Term Goals: Ability to remain free from injury will improve, Ability to verbalize frustration and anger appropriately will improve, Ability to demonstrate self-control, Ability to participate in decision making will improve, Ability to verbalize feelings will improve, Ability to disclose and discuss suicidal ideas, Ability to identify and develop effective coping behaviors will improve, and Compliance with prescribed medications will improve  Medication Management: RN will administer medications as ordered by provider, will assess and evaluate patient's response and provide education  to patient for prescribed medication. RN will report any adverse and/or side effects to prescribing provider.  Therapeutic Interventions: 1 on 1 counseling sessions, Psychoeducation, Medication administration, Evaluate responses to treatment, Monitor vital signs and CBGs as ordered, Perform/monitor CIWA, COWS, AIMS and Fall Risk screenings as ordered, Perform wound care treatments as ordered.  Evaluation of Outcomes: Not Progressing   LCSW Treatment Plan for Primary Diagnosis: Substance induced mood disorder (HCC) Long Term Goal(s): Safe transition to appropriate next level of care at discharge, Engage patient in therapeutic group addressing interpersonal concerns.  Short Term Goals: Engage patient in aftercare planning with referrals and resources, Increase social support, Increase ability to appropriately verbalize feelings, Increase emotional regulation, Facilitate acceptance of mental health diagnosis and concerns, Facilitate patient progression through stages of change regarding substance use diagnoses and concerns, and Identify triggers associated with mental health/substance abuse issues  Therapeutic Interventions: Assess for all discharge needs, 1 to 1 time with Social worker, Explore available resources and support systems, Assess for adequacy in community support network, Educate family and significant other(s) on suicide prevention, Complete Psychosocial Assessment, Interpersonal group therapy.  Evaluation of Outcomes: Not Progressing   Progress in Treatment: Attending groups: Yes. Participating in groups: Yes. Taking medication as prescribed:  patient was just admitted to the hospital Toleration medication:  patient was just admitted to the hospital Family/Significant other contact made:  consents pending Patient understands diagnosis: No. Discussing patient identified problems/goals with staff: No. Medical problems stabilized or resolved: Yes. Denies suicidal/homicidal  ideation: Yes. Issues/concerns per patient self-inventory: No.  New problem(s) identified:  No  New Short Term/Long Term Goal(s):   medication stabilization, elimination of SI thoughts, development of comprehensive mental wellness plan.    Patient Goals:  "my car broke down.  Eventually I will go to work and find a new place to live."  Patient lives in an apartment.  "Ive been better."  Patient would like to receive medication management and therapy in the community but cannot afford it and cannot apply for Medicaid.    Discharge Plan or Barriers:  Patient recently admitted. CSW will continue to follow and assess for appropriate referrals and possible discharge planning.    Reason for Continuation of Hospitalization: Depression Hallucinations  Estimated Length of Stay:  5 - 7 days  Last 3 [place] Suicide Severity Risk Score: Flowsheet Row [facility] (Current[facility] from 04/10/2023 in [facility] ED from 04/09/2023 in [facility] Emergency Department at [facility]  C-SSRS RISK CATEGORY Low Risk No Risk No Risk       Last [facility] 2/9 Scores:     No data to display          Scribe for Treatment Team: Nyra Jabs 04/13/2023 11:32 AM

## 2023-04-13 NOTE — Group Note (Signed)
Date:  04/13/2023 Time:  9:09 PM  Group Topic/Focus:  Wrap-Up Group:   The focus of this group is to help patients review their daily goal of treatment and discuss progress on daily workbooks.    Participation Level:  Active  Participation Quality:  Appropriate  Affect:  Appropriate  Cognitive:  Appropriate  Insight: Appropriate  Engagement in Group:  Engaged  Modes of Intervention:  Education  Additional Comments:  Patient attended and participated in group tonight. He reports that today the food was the best thing that happen for him.  Lita Mains [facility] 04/13/2023, 9:09 PM

## 2023-04-13 NOTE — H&P (Signed)
Psychiatric Admission Assessment Adult  Patient Identification: Dylan Gay  MRN:  [id number]  Date of Evaluation:  04/13/23  Chief Complaint:  Substance induced mood disorder (HCC) [F19.94]   Principal Diagnosis: Substance induced mood disorder (HCC)  Diagnosis:  Principal Problem:   Substance induced mood disorder (HCC) Active Problems:   Psychoactive substance-induced psychosis (HCC)   Cocaine abuse (HCC)   Polysubstance abuse (HCC)    Chief Complaint: "I was seeing and hearing things."   History of Present Illness: Dylan Gay is a 27 y.o. who  has a past medical history of Polysubstance abuse (HCC), Substance induced mood disorder (HCC), and Substance-induced psychotic disorder (HCC).  He presented to [facility] ([place] for Substance induced mood disorder (HCC).  He reported auditory hallucinations, visual hallucinations of snakes, and suicidal ideation.  On exam today the patient reports complete resolution of auditory hallucinations and visual hallucinations of steaks.  He reports next-day sedation from Zyprexa.  Though he denies overt paranoia, he does make some statements consistent with paranoid ideation.  Specifically, he discusses finding a new place to live because of "being freaked out" by the auditory hallucinations visual hallucinations he suffered while abusing substances.  Assuming the patient continues to improve, and suffers no more psychotic symptoms, plan will be to discharge tomorrow.   Past Psychiatric History: He  has a past medical history of Polysubstance abuse (HCC), Substance induced mood disorder (HCC), and Substance-induced psychotic disorder (HCC).   Is the patient at risk to self? No Has the patient been a risk to self in the past 6 months? No Has the patient been a risk to self within the distant past? No Is the patient a risk to others? No Has the patient been a risk to others in the past 6 months? No Has the patient been a  risk to others within the distant past? No  [place] Scale:  Flowsheet Row Ad[facility] (Current) [facility] from 04/10/2023 in [facility] ED from 04/09/2023 in [facility] Emergency Department at [facility]  C-SSRS RISK CATEGORY Low Risk No Risk No Risk          Prior Inpatient Therapy: Denies Prior Outpatient Therapy: Denies  Alcohol Screening:  1. How often do you have a drink containing alcohol?: Never 2. How many drinks containing alcohol do you have on a typical day when you are drinking?: 1 or 2 3. How often do you have six or more drinks on one occasion?: Never AUDIT-C Score: 0 4. How often during the last year have you found that you were not able to stop drinking once you had started?: Never 5. How often during the last year have you failed to do what was normally expected from you because of drinking?: Never 6. How often during the last year have you needed a first drink in the morning to get yourself going after a heavy drinking session?: Never 7. How often during the last year have you had a feeling of guilt of remorse after drinking?: Never 8. How often during the last year have you been unable to remember what happened the night before because you had been drinking?: Never 9. Have you or someone else been injured as a result of your drinking?: No 10. Has a relative or friend or a doctor or another health worker been concerned about your drinking or suggested you cut down?: No Alcohol Use Disorder Identification Test Final Score (AUDIT):  0  Substance Abuse History in the last 12 months: Endorses Consequences of Substance Abuse: NA  Previous Psychotropic Medications: Yes Psychological Evaluations: No  Past Medical History:  Past Medical History:  Diagnosis Date   Polysubstance abuse (HCC)    Substance induced mood disorder (HCC)    Substance-induced psychotic disorder [facility])       Family Psychiatric & Medical History: Denies History reviewed. No pertinent family history.   Tobacco Screening:  Social History   Tobacco Use  Smoking Status Former   Types: Cigarettes  Smokeless Tobacco Never      Social History:  Social History   Substance and Sexual Activity  Alcohol Use Yes   Comment: not daily       Additional Social History:       Allergies:  No Known Allergies   Lab Results:  No results found for this or any previous visit (from the past 48 hours).   Blood Alcohol level:  Lab Results  Component Value Date   ETH <10 04/09/2023   ETH <10 03/23/2020    Metabolic Disorder Labs:  Lab Results  Component Value Date   HGBA1C 5.5 03/25/2020   MPG 111.15 03/25/2020   No results found for: "PROLACTIN"  Lab Results  Component Value Date   CHOL 150 03/25/2020   TRIG 176 (H) 03/25/2020   HDL 39 (L) 03/25/2020   VLDL 35 03/25/2020   LDLCALC 76 03/25/2020      Current Medications: Current Facility-Administered Medications  Medication Dose Route Frequency Provider Last Rate Last Admin   acetaminophen (TYLENOL) tablet 650 mg  650 mg Oral Q6H PRN Bennett, Christal H, NP       alum & mag hydroxide-simeth (MAALOX/MYLANTA) 200-200-20 MG/5ML suspension 30 mL  30 mL Oral Q4H PRN Bennett, Christal H, NP       haloperidol (HALDOL) tablet 5 mg  5 mg Oral TID PRN Bennett, Christal H, NP       And   diphenhydrAMINE (BENADRYL) capsule 50 mg  50 mg Oral TID PRN Bennett, Christal H, NP       haloperidol lactate (HALDOL) injection 5 mg  5 mg Intramuscular TID PRN Bennett, Christal H, NP       And   diphenhydrAMINE (BENADRYL) injection 50 mg  50 mg Intramuscular TID PRN Bennett, Christal H, NP       And   LORazepam (ATIVAN) injection 2 mg  2 mg Intramuscular TID PRN Bennett, Christal H, NP       haloperidol lactate (HALDOL) injection 10 mg  10 mg Intramuscular TID PRN Bennett, Christal H, NP       And   diphenhydrAMINE (BENADRYL) injection 50 mg   50 mg Intramuscular TID PRN Bennett, Christal H, NP       And   LORazepam (ATIVAN) injection 2 mg  2 mg Intramuscular TID PRN Bennett, Christal H, NP       hydrOXYzine (ATARAX) tablet 25 mg  25 mg Oral TID PRN Bennett, Christal H, NP   25 mg at 04/13/23 0829   magnesium hydroxide (MILK OF MAGNESIA) suspension 30 mL  30 mL Oral Daily PRN Bennett, Christal H, NP       OLANZapine zydis (ZYPREXA) disintegrating tablet 5 mg  5 mg Oral QHS Bennett, Christal H, NP   5 mg at 04/12/23 2038   traZODone (DESYREL) tablet 50 mg  50 mg Oral QHS PRN Willeen Cass, Christal H, NP   50 mg at 04/12/23 2038    PTA  Medications: Medications Prior to Admission  Medication Sig Dispense Refill Last Dose/Taking   OLANZapine zydis (ZYPREXA) 5 MG disintegrating tablet Take 1 tablet (5 mg total) by mouth at bedtime. 1 tablet 0      Musculoskeletal: Strength & Muscle Tone: within normal limits Gait & Station: normal Patient leans: N/A    Psychiatric Specialty Exam:  Presentation  General Appearance: Disheveled  Eye Contact: Good  Speech: Clear and Coherent  Speech Volume: Normal  Handedness: Right   Mood and Affect  Mood: Euthymic  Affect: Full Range   Thought Process  Thought Processes: Linear  Descriptions of Associations: Intact  Orientation: Full (Time, Place and Person)  Thought Content: Paranoid Ideation  History of Schizophrenia/Schizoaffective disorder: No  Duration of Psychotic Symptoms: NA Hallucinations: Hallucinations: None  Ideas of Reference: None  Suicidal Thoughts: Suicidal Thoughts: No  Homicidal Thoughts: Homicidal Thoughts: No   Sensorium  Memory: Immediate Good; Recent Good  Judgment: Fair  Insight: Fair   Art therapist  Concentration: Good  Attention Span: Good  Recall: Good  Fund of Knowledge: Good  Language: Good   Psychomotor Activity  Psychomotor Activity: Psychomotor Activity: Normal   Assets  Assets: Communication Skills;  Housing   Sleep  Sleep: Sleep: Good    Physical Exam: General: Sitting comfortably. NAD. HEENT: Normocephalic, atraumatic, MMM, EMOI Lungs: no increased work of breathing noted Heart: no cyanosis Abdomen: Non distended Musculoskeletal: FROM. No obvious deformities Skin: Warm, dry, intact. No rashes noted Neuro: No obvious focal deficits.  Gait and station are normal  Review of Systems  Constitutional: Negative.   HENT: Negative.    Eyes: Negative.   Respiratory: Negative.    Cardiovascular: Negative.   Gastrointestinal: Negative.   Genitourinary: Negative.   Skin: Negative.   Neurological: Negative.   Psychiatric/Behavioral:  Positive for paranoia.     Blood pressure 108/77, pulse 80, temperature 98.1 F (36.7 C), temperature source Oral, resp. rate 16, height 5\' 4"  (1.626 m), weight 83.9 kg, SpO2 100%. Body mass index is 31.76 kg/m.   Treatment Plan Summary: ASSESSMENT: Dylan Gay is an 27 y.o. male who  has a past medical history of Polysubstance abuse (HCC), Substance induced mood disorder (HCC), and Substance-induced psychotic disorder (HCC).  He presented on 04/12/2023  3:00 PM for Substance induced mood disorder (HCC).    Diagnoses / Active Problems: Patient Active Problem List   Diagnosis Date Noted   Polysubstance abuse (HCC) 04/13/2023   Substance induced mood disorder (HCC) 04/12/2023   Cocaine abuse (HCC) 03/23/2020   Psychoactive substance-induced psychosis (HCC) 03/13/2020     PLAN: Safety and Monitoring:  -- Voluntary admission to inpatient psychiatric unit for safety, stabilization and treatment  -- Daily contact with patient to assess and evaluate symptoms and progress in treatment  -- Patient's case to be discussed in multi-disciplinary team meeting  -- Observation Level : q15 minute checks  -- Vital signs:  q12 hours  -- Precautions: suicide, elopement, and assault  2. Psychiatric Diagnoses and Treatment:   -- Psychoactive  substance induced psychosis, substance-induced mood disorder, polysubstance abuse   OLANZapine zydis  5 mg Oral QHS     3. Medical Issues Being Addressed:    Labs reviewed, unremarkable with the exception of: Mild hyponatremia of 134, hypocalcemia of 3.0, will check routine intake labs    4. Discharge Planning:   -- Social work and case management to assist with discharge planning and identification of hospital follow-up needs prior to discharge  -- Estimated  LOS: Anticipated discharge home on 04/14/2023  -- Discharge Concerns: Need to establish a safety plan; Medication compliance and effectiveness  -- Discharge Goals: Return home with outpatient referrals for mental health follow-up including medication management/psychotherapy  5. Short Term Goals:  Improve ability to identify changes in lifestyle to reduce recurrence of condition, verbalize feelings, disclose and discuss suicidal ideas, demonstrate self-control, identify and develop effective coping behaviors, compliance with prescribed medications, identify triggers associated with substance abuse/mental health issues, participate in unit milieu and in scheduled group therapies   6. Long Term Goals: Improvement in symptoms so the patient is ready for discharge   --The risks/benefits/side-effects/alternatives to the medications above were discussed in detail with the patient and time was given for questions. The patient provided informed consent.   -- Metabolic profile and EKG monitoring obtained while on an atypical antipsychotic and listed in the EHR    Total Time Spent in Direct Patient Care:  I personally spent 60 minutes on the unit in direct patient care. The direct patient care time included face-to-face time with the patient, reviewing the patient's chart, communicating with other professionals, and coordinating care. Greater than 50% of this time was spent in counseling or coordinating care with the patient regarding goals  of hospitalization, psycho-education, and discharge planning needs.   I certify that inpatient services furnished can reasonably be expected to improve the patient's condition.    Criss Alvine, MD 04/13/2023, 12:32 PM      Portions of this note were created using voice recognition software. Minor syntax errors, grammatical content, spelling, or punctuation errors may have occurred unintentionally. Please notify the Thereasa Parkin if the meaning of any statement is unclear.

## 2023-04-14 ENCOUNTER — Encounter (HOSPITAL_COMMUNITY): Payer: Self-pay

## 2023-04-14 DIAGNOSIS — F1994 Other psychoactive substance use, unspecified with psychoactive substance-induced mood disorder: Secondary | ICD-10-CM

## 2023-04-14 DIAGNOSIS — E559 Vitamin D deficiency, unspecified: Secondary | ICD-10-CM

## 2023-04-14 HISTORY — DX: Vitamin D deficiency, unspecified: E55.9

## 2023-04-14 LAB — RPR: RPR Ser Ql: NONREACTIVE

## 2023-04-14 MED ORDER — CHOLECALCIFEROL 125 MCG (5000 UT) PO TABS: 1.0000 | ORAL_TABLET | Freq: Every day | ORAL | 3 refills | Status: AC

## 2023-04-14 MED ORDER — VITAMIN D (ERGOCALCIFEROL) 1.25 MG (50000 UNIT) PO CAPS
50000.0000 [IU] | ORAL_CAPSULE | ORAL | Status: DC
  Administered 2023-04-14: 50000 [IU] via ORAL
  Filled 2023-04-14 (×2): qty 1

## 2023-04-14 MED ORDER — VITAMIN D (ERGOCALCIFEROL) 1.25 MG (50000 UNIT) PO CAPS: 50000.0000 [IU] | ORAL_CAPSULE | ORAL | 0 refills | Status: AC

## 2023-04-14 MED ORDER — OLANZAPINE 5 MG PO TABS: 5.0000 mg | ORAL_TABLET | Freq: Every day | ORAL | 0 refills | Status: AC

## 2023-04-14 NOTE — Plan of Care (Signed)
°  Problem: Education: °Goal: Emotional status will improve °Outcome: Progressing °Goal: Mental status will improve °Outcome: Progressing °Goal: Verbalization of understanding the information provided will improve °Outcome: Progressing °  °

## 2023-04-14 NOTE — Progress Notes (Signed)
  [facility] Adult Case Management Discharge Plan :  Will you be returning to the same living situation after discharge:  Yes,  Mother is picking up at 1pm, patient is returning home to his own apartment, where he lives with a roommate.  At discharge, do you have transportation home?: Yes,  Patients mother is picking him up. Do you have the ability to pay for your medications: Yes,  Patient reports being able to pay cash for his medications.   Release of information consent forms completed and in the chart;  Patient's signature needed at discharge.  Patient to Follow up at:  Follow-up Information     [facility]. Go on 04/17/2023.   Specialty: Urgent Care Why: Please go to this provider for medication management services.  You may go on Monday through Friday, arrive by 7:00 am for same day service. You do not need insurance for these services. Contact information: [address] [place] [address] [phone]        [place] Go to.   Specialty: Professional Counselor Why: Please go to this provider for therapy services, on Monday through Friday, between 9 am and 1 pm.  * You do not need insurance for these services. Contact information: [address] [place] [address] [phone]                  Safety Planning and Suicide Prevention discussed: Yes,  CSW completed Safety Planning and Suicide Prevention with Patient 12/28 before discharge.    Has patient been referred to the Quitline?: Patient refused referral for treatment  Patient has been referred for addiction treatment: No known addiction issue.   Eileen Croswell L Nicki Furlan, LCSWA 04/14/2023, 9:57 AM

## 2023-04-14 NOTE — Discharge Summary (Signed)
Physician Discharge Summary Note  Patient:  Dylan Gay is a 27 y.o. male  MRN:  [id number]  DOB:  28-Nov-1995  Patient phone: [phone] (home)  Patient address:   [address] [place] [address]   Total Time spent with patient: 58 Minutes  Date of Admission:  04/12/2023  Date of Discharge: 04/14/23   Reason for Admission: Substance-induced psychosis  Chief Complaint: "I was seeing and hearing things."    History of Present Illness: Dylan Gay is a 27 y.o. who  has a past medical history of Polysubstance abuse (HCC), Substance induced mood disorder (HCC), and Substance-induced psychotic disorder (HCC).  He presented to [facility] for Substance induced mood disorder (HCC).  He reported auditory hallucinations, visual hallucinations of snakes, and suicidal ideation.   On exam today the patient reports complete resolution of auditory hallucinations and visual hallucinations of steaks.  He reports next-day sedation from Zyprexa.  Though he denies overt paranoia, he does make some statements consistent with paranoid ideation.  Specifically, he discusses finding a new place to live because of "being freaked out" by the auditory hallucinations visual hallucinations he suffered while abusing substances.   Assuming the patient continues to improve, and suffers no more psychotic symptoms, plan will be to discharge tomorrow.    Principal Problem: Substance induced mood disorder [facility])  Discharge Diagnoses: Principal Problem:   Substance induced mood disorder (HCC) Active Problems:   Psychoactive substance-induced psychosis (HCC)   Cocaine abuse (HCC)   Polysubstance abuse (HCC)     Past Psychiatric (and medical) History: Dylan Gay  has a past medical history of Polysubstance abuse (HCC), Substance induced mood disorder (HCC), Substance-induced psychotic disorder (HCC), and Vitamin D deficiency (04/14/2023).   Past  Medical History:  Past Medical History:  Diagnosis Date   Polysubstance abuse (HCC)    Substance induced mood disorder (HCC)    Substance-induced psychotic disorder (HCC)    Vitamin D deficiency 04/14/2023     History reviewed. No pertinent surgical history.   Family History: History reviewed. No pertinent family history.   Family Psychiatric  History: Denies  Social History:  Social History   Substance and Sexual Activity  Alcohol Use Yes   Comment: not daily      Social History   Substance and Sexual Activity  Drug Use Yes   Types: Marijuana, Cocaine, Benzodiazepines, Methamphetamines     Social History   Socioeconomic History   Marital status: Single    Spouse name: Not on file   Number of children: Not on file   Years of education: Not on file   Highest education level: Not on file  Occupational History   Not on file  Tobacco Use   Smoking status: Former    Types: Cigarettes   Smokeless tobacco: Never  Vaping Use   Vaping status: Never Used  Substance and Sexual Activity   Alcohol use: Yes    Comment: not daily    Drug use: Yes    Types: Marijuana, Cocaine, Benzodiazepines, Methamphetamines   Sexual activity: Yes  Other Topics Concern   Not on file  Social History Narrative   Not on file   Social Drivers of Health   Financial Resource Strain: Not on file  Food Insecurity: No Food Insecurity (04/12/2023)   Hunger Vital Sign    Worried About Running Out of Food in the Last Year: Never true    Ran Out of Food in the Last Year: Never  true  Transportation Needs: No Transportation Needs (04/12/2023)   PRAPARE - Administrator, Civil Service (Medical): No    Lack of Transportation (Non-Medical): No  Physical Activity: Not on file  Stress: Not on file  Social Connections: Not on file     Hospital Course:  During the patient's hospitalization, patient had extensive initial psychiatric evaluation, and follow-up psychiatric evaluations  every day.  Psychiatric diagnoses provided upon initial assessment: Substance induced mood disorder (HCC) [F19.94]   The patient was started on Zyprexa 5 mg nightly which was continued throughout the hospitalization.  The patient reported complete resolution of auditory and visual hallucinations.  Patient's symptoms appear to be consistent with a substance-induced psychosis; however, despite a low suspicion, a schizophrenia spectrum disorder cannot be completely excluded at this time.  Vitamin D was started for vitamin D deficiency.  He was prescribed 4 pills of ergocalciferol 50,000 units to take weekly, and advised to start taking 5000 units a day over-the-counter.  Patient's care was discussed during the interdisciplinary team meeting every day during the hospitalization.  The patient denies having side effects to prescribed psychiatric medication with the exception of mild next-day sedation.  Gradually, patient started adjusting to milieu. The patient was evaluated each day by a clinical provider to ascertain response to treatment. Improvement was noted by the patient's report of decreasing symptoms, improved sleep and appetite, affect, medication tolerance, behavior, and participation in unit programming.  Patient was asked each day to complete a self inventory noting mood, mental status, pain, new symptoms, anxiety and concerns.    Symptoms were reported as significantly decreased or resolved completely by discharge.   On day of discharge, the patient reports that their mood is stable. The patient denied having suicidal thoughts for more than 48 hours prior to discharge.  Patient denies having homicidal thoughts.  Patient denies having auditory hallucinations.  Patient denies any visual hallucinations or other symptoms of psychosis. The patient was motivated to continue taking medication with a goal of continued improvement in mental health.   The patient reports their target psychiatric  symptoms of auditory and visual hallucinations responded well to the psychiatric medications, and the patient reports overall benefit other psychiatric hospitalization. Supportive psychotherapy was provided to the patient. The patient also participated in regular group therapy while hospitalized. Coping skills, problem solving as well as relaxation therapies were also part of the unit programming.  Labs were reviewed with the patient, and abnormal results were discussed with the patient.  The patient is able to verbalize their individual safety plan to this provider.    Physical Findings:  AIMS:  Facial and Oral Movements: None Muscles of Facial Expression: None Lips and Perioral Area: None Jaw: None Tongue: None,Extremity Movements Upper (arms, wrists, hands, fingers): None Lower (legs, knees, ankles, toes): None, Trunk Movements Neck, shoulders, hips: None, Global Judgements Severity of abnormal movements overall: None Incapacitation due to abnormal movements: None Patient's awareness of abnormal movements: No Awareness, Dental Status Current problems with teeth and/or dentures: No Does patient usually wear dentures: No Edentia: No   CIWA:   NA  COWS:  NA  Musculoskeletal: Strength & Muscle Tone: within normal limits Gait & Station: normal Patient leans: N/A    Psychiatric Specialty Exam:  Presentation  General Appearance: Appropriate for Environment  Eye Contact: Good  Speech: Clear and Coherent; Normal Rate  Speech Volume: Normal  Handedness: Right   Mood and Affect  Mood: Euthymic  Affect: Full Range   Thought  Process  Thought Processes: Linear; Coherent; Goal Directed  Descriptions of Associations: Intact  Orientation: Full (Time, Place and Person)  Thought Content: Logical  History of Schizophrenia/Schizoaffective disorder: No  Duration of Psychotic Symptoms: NA Hallucinations: Hallucinations: None  Ideas of Reference: None  Suicidal  Thoughts: Suicidal Thoughts: No  Homicidal Thoughts: Homicidal Thoughts: No   Sensorium  Memory: Immediate Good; Recent Good  Judgment: Poor  Insight: Fair   Art therapist  Concentration: Good  Attention Span: Good  Recall: Good  Fund of Knowledge: Good  Language: Good   Psychomotor Activity  Psychomotor Activity: Psychomotor Activity: Normal   Assets  Assets: Communication Skills; Housing; Physical Health   Sleep  Sleep: Sleep: Good Number of Hours of Sleep: 7.75      Physical Exam: General: Sitting comfortably. NAD. HEENT: Normocephalic, atraumatic, MMM, EMOI Lungs: no increased work of breathing noted Heart: no cyanosis Abdomen: Non distended Musculoskeletal: FROM. No obvious deformities Skin: Warm, dry, intact. No rashes noted Neuro: No obvious focal deficits.  Gait and station are normal  Review of Systems:  Constitutional: Negative.   HENT: Negative.    Eyes: Negative.   Respiratory: Negative.    Cardiovascular: Negative.   Gastrointestinal: Negative.   Genitourinary: Negative.   Skin: Negative.   Neurological: Negative.   Psychiatric/Behavioral:  Negative  Blood pressure 120/75, pulse 84, temperature 98.1 F (36.7 C), temperature source Oral, resp. rate 16, height 5\' 4"  (1.626 m), weight 83.9 kg, SpO2 100%. Body mass index is 31.76 kg/m.    Social History   Tobacco Use  Smoking Status Former   Types: Cigarettes  Smokeless Tobacco Never     Tobacco Cessation: Non-smoker A prescription for an FDA approved medication for tobacco cessation was not prescribed at discharge   Blood Alcohol level:  Lab Results  Component Value Date   [facility] <10 04/09/2023   ETH <10 03/23/2020    Metabolic Disorder Labs:  Lab Results  Component Value Date   HGBA1C 5.5 03/25/2020   MPG 111.15 03/25/2020   No results found for: "PROLACTIN"  Lab Results  Component Value Date   CHOL 127 04/13/2023   TRIG 120 04/13/2023   HDL 33 (L)  04/13/2023   VLDL 24 04/13/2023   LDLCALC 70 04/13/2023   LDLCALC 76 03/25/2020      See Psychiatric Specialty Exam and Suicide Risk Assessment completed by Attending Physician prior to discharge.  Discharge destination: Home  Is patient on multiple antipsychotic therapies at discharge:  No  Has Patient had three or more failed trials of antipsychotic monotherapy by history: NA Recommended Plan for Multiple Antipsychotic Therapies: NA   Discharge Instructions     Diet - low sodium heart healthy   Complete by: As directed    Increase activity slowly   Complete by: As directed         Allergies as of 04/14/2023   No Known Allergies      Medication List     STOP taking these medications    OLANZapine zydis 5 MG disintegrating tablet Commonly known as: ZYPREXA Replaced by: OLANZapine 5 MG tablet       TAKE these medications      Indication  Cholecalciferol 125 MCG (5000 UT) Tabs Take 1 tablet (5,000 Units total) by mouth daily.  Indication: Vitamin D Deficiency   OLANZapine 5 MG tablet Commonly known as: ZyPREXA Take 1 tablet (5 mg total) by mouth at bedtime. Replaces: OLANZapine zydis 5 MG disintegrating tablet  Indication: Substance induced psychotic  disorder   Vitamin D (Ergocalciferol) 1.25 MG (50000 UNIT) Caps capsule Commonly known as: DRISDOL Take 1 capsule (50,000 Units total) by mouth every 7 (seven) days.  Indication: Vitamin D Deficiency          Follow-up Information     [facility]. Go on 04/17/2023.   Specialty: Urgent Care Why: Please go to this provider for medication management services.  You may go on Monday through Friday, arrive by 7:00 am for same day service. You do not need insurance for these services. Contact information: [address] [place] [address] [phone]        [facility] Go to.   Specialty: Professional Counselor Why: Please go to this  provider for therapy services, on Monday through Friday, between 9 am and 1 pm.  * You do not need insurance for these services. Contact information: [address] [place] [address] [phone]                    Follow-up recommendations:  - It is recommended to the patient to continue psychiatric medications as prescribed, after discharge from the hospital.   - It is recommended to the patient to follow up with your outpatient psychiatric provider and PCP. - It was discussed with the patient, the impact of alcohol, drugs, tobacco have been there overall psychiatric and medical wellbeing, and total abstinence from substance use was recommended the patient. - Prescriptions provided or sent directly to preferred pharmacy at discharge. Patient agreeable to plan. Given opportunity to ask questions. Appears to feel comfortable with discharge.   - In the event of worsening symptoms, the patient is instructed to call the crisis hotline, 911 and or go to the nearest ED for appropriate evaluation and treatment of symptoms. To follow-up with primary care provider for other medical issues, concerns and or health care needs - Patient was discharged home with a plan to follow up as noted above.   Comments:  NA  Signed: Criss Alvine, MD 04/14/23 10:45 AM

## 2023-04-14 NOTE — Plan of Care (Signed)
  Problem: Education: Goal: Knowledge of [facility] Education information/materials will improve Outcome: Adequate for Discharge Goal: Emotional status will improve 04/14/2023 0958 by Gardiner Barefoot, RN Outcome: Adequate for Discharge 04/14/2023 0956 by Gardiner Barefoot, RN Outcome: Progressing Goal: Mental status will improve 04/14/2023 0958 by Gardiner Barefoot, RN Outcome: Adequate for Discharge 04/14/2023 0956 by Gardiner Barefoot, RN Outcome: Progressing Goal: Verbalization of understanding the information provided will improve 04/14/2023 0958 by Gardiner Barefoot, RN Outcome: Adequate for Discharge 04/14/2023 0956 by Gardiner Barefoot, RN Outcome: Progressing   Problem: Activity: Goal: Interest or engagement in activities will improve Outcome: Adequate for Discharge Goal: Sleeping patterns will improve Outcome: Adequate for Discharge   Problem: Coping: Goal: Ability to verbalize frustrations and anger appropriately will improve Outcome: Adequate for Discharge Goal: Ability to demonstrate self-control will improve Outcome: Adequate for Discharge   Problem: Health Behavior/Discharge Planning: Goal: Identification of resources available to assist in meeting health care needs will improve Outcome: Adequate for Discharge Goal: Compliance with treatment plan for underlying cause of condition will improve Outcome: Adequate for Discharge   Problem: Physical Regulation: Goal: Ability to maintain clinical measurements within normal limits will improve Outcome: Adequate for Discharge   Problem: Safety: Goal: Periods of time without injury will increase Outcome: Adequate for Discharge

## 2023-04-14 NOTE — Progress Notes (Signed)
Patient ID: Dylan Gay, male   DOB: March 14, 1996, 27 y.o.   MRN: [id number]  Patient discharged with transportation provided by his mother. Patient denies SI/HI/AVH with no distress noted.

## 2023-04-14 NOTE — [facility] Suicide Risk Assessment (Signed)
[facility] Discharge Suicide Risk Assessment   Principal Problem: Substance induced mood disorder (HCC)  Discharge Diagnoses: Principal Problem:   Substance induced mood disorder (HCC) Active Problems:   Psychoactive substance-induced psychosis (HCC)   Cocaine abuse (HCC)   Polysubstance abuse (HCC)      Total Time spent with patient: 40  Musculoskeletal: Strength & Muscle Tone: within normal limits Gait & Station: normal Patient leans: N/A   Psychiatric Specialty Exam:  Presentation  General Appearance: Appropriate for Environment  Eye Contact: Good  Speech: Clear and Coherent; Normal Rate  Speech Volume: Normal  Handedness: Right   Mood and Affect  Mood: Euthymic  Affect: Full Range   Thought Process  Thought Processes: Linear; Coherent; Goal Directed  Descriptions of Associations: Intact  Orientation: Full (Time, Place and Person)  Thought Content: Logical  History of Schizophrenia/Schizoaffective disorder: No  Duration of Psychotic Symptoms: NA Hallucinations: Hallucinations: None  Ideas of Reference: None  Suicidal Thoughts: Suicidal Thoughts: No  Homicidal Thoughts: Homicidal Thoughts: No   Sensorium  Memory: Immediate Good; Recent Good  Judgment: Poor  Insight: Fair   Art therapist  Concentration: Good  Attention Span: Good  Recall: Good  Fund of Knowledge: Good  Language: Good   Psychomotor Activity  Psychomotor Activity: Psychomotor Activity: Normal   Assets  Assets: Communication Skills; Housing; Physical Health   Sleep  Sleep: Sleep: Good Number of Hours of Sleep: 7.75   Physical Exam: General: Sitting comfortably. NAD. HEENT: Normocephalic, atraumatic, MMM, EMOI Lungs: no increased work of breathing noted Heart: no cyanosis Abdomen: Non distended Musculoskeletal: FROM. No obvious deformities Skin: Warm, dry, intact. No rashes noted Neuro: No obvious focal deficits.  Gait and station are normal  Review  of Systems  Constitutional: Negative.   HENT: Negative.    Eyes: Negative.   Respiratory: Negative.    Cardiovascular: Negative.   Gastrointestinal: Negative.   Genitourinary: Negative.   Skin: Negative.   Neurological: Negative.   Psychiatric/Behavioral:  Negative   Mental Status Per Nursing Assessment: Suicidal ideation indicated by patient  Demographic Factors:  Male, Low socioeconomic status, and Living alone  Loss Factors: NA  Historical Factors: Family history of mental illness or substance abuse  Risk Reduction Factors:   Religious beliefs about death, Employed, and Positive coping skills or problem solving skills  Continued Clinical Symptoms:  Previous psychiatric diagnoses and treatments  Cognitive Features That Contribute To Risk:  None  Suicide Risk:  Minimal: No identifiable suicidal ideation.  Patients presenting with no risk factors but with morbid ruminations; may be classified as minimal risk based on the severity of the depressive symptoms.    Follow-up Information     [facility]. Go on 04/17/2023.   Specialty: Urgent Care Why: Please go to this provider for medication management services.  You may go on Monday through Friday, arrive by 7:00 am for same day service. You do not need insurance for these services. Contact information: [address] [place] [address] [phone]        [facility] Go to.   Specialty: Professional Counselor Why: Please go to this provider for therapy services, on Monday through Friday, between 9 am and 1 pm.  * You do not need insurance for these services. Contact information: [address] [place] [address] [phone]                  Plan Of Care/Follow-up recommendations:  Activity: as tolerated  Diet:  heart healthy  Other: -Follow-up with your outpatient psychiatric provider -instructions on appointment date,  time, and address (location) are provided to you in discharge paperwork.  -Take your psychiatric medications as prescribed at discharge - instructions are provided to you in the discharge paperwork  -Follow-up with outpatient primary care doctor and other specialists -for management of preventative medicine and chronic medical issues  -Testing: Follow-up with outpatient provider for abnormal lab results: Vitamin D deficiency (18.48), elevated ALT (57)  -If you are prescribed an atypical antipsychotic medication, we recommend that your outpatient psychiatrist follow routine screening for side effects within 3 months of discharge, including monitoring: AIMS scale, height, weight, blood pressure, fasting lipid panel, HbA1c, and fasting blood sugar.   -Recommend total abstinence from alcohol, tobacco, and other illicit drug use at discharge.   -If your psychiatric symptoms recur, worsen, or if you have side effects to your psychiatric medications, call your outpatient psychiatric provider, 911, [phone] or go to the nearest emergency department.  -If suicidal thoughts occur, immediately call your outpatient psychiatric provider, 911, 988 or go to the nearest emergency department.   Criss Alvine, MD 04/14/23 10:36 AM

## 2023-04-14 NOTE — Progress Notes (Signed)
   04/14/23 0915  Psych Admission Type (Psych Patients Only)  Admission Status Voluntary  Psychosocial Assessment  Patient Complaints Sleep disturbance  Eye Contact Fair  Facial Expression Anxious  Affect Anxious  Speech Logical/coherent  Interaction Assertive  Motor Activity Other (Comment) (WDL)  Appearance/Hygiene Unremarkable  Behavior Characteristics Cooperative;Anxious  Mood Anxious  Thought Process  Coherency Circumstantial  Content WDL  Delusions None reported or observed  Perception WDL  Hallucination None reported or observed  Judgment Impaired  Confusion None  Danger to Self  Current suicidal ideation? Denies  Agreement Not to Harm Self Yes  Description of Agreement Verbal  Danger to Others  Danger to Others None reported or observed

## 2023-04-14 NOTE — [facility] Suicide Risk Assessment (Signed)
[facility] INPATIENT:  Family/Significant Other Suicide Prevention Education  Suicide Prevention Education:  Patient Refusal for Family/Significant Other Suicide Prevention Education: The patient Dylan Gay has refused to provide written consent for family/significant other to be provided Family/Significant Other Suicide Prevention Education during admission and/or prior to discharge.  Physician notified.  CSW completed safety planning with the patient. Patient does not have any guns in the home. He lives with a roommate. His mother is picking him up today at 1pm.   Laurene Melendrez L Addisynn Vassell LCSWA 04/14/2023, 9:50 AM

## 2023-04-16 LAB — HEMOGLOBIN A1C
Hgb A1c MFr Bld: 5.8 % — ABNORMAL HIGH (ref 4.8–5.6)
Mean Plasma Glucose: 120 mg/dL

## 2023-04-17 LAB — VITAMIN B1: Vitamin B1 (Thiamine): 127.4 nmol/L (ref 66.5–200.0)
